# Patient Record
Sex: Female | Born: 1935 | Race: White | Hispanic: No | Marital: Married | Smoking: Former smoker
Health system: Southern US, Community
[De-identification: ages and names within clinical notes are randomized; demographics above are authoritative.]

## PROBLEM LIST (undated history)

## (undated) DIAGNOSIS — M8580 Other specified disorders of bone density and structure, unspecified site: Secondary | ICD-10-CM

## (undated) DIAGNOSIS — Z5189 Encounter for other specified aftercare: Secondary | ICD-10-CM

## (undated) DIAGNOSIS — J449 Chronic obstructive pulmonary disease, unspecified: Secondary | ICD-10-CM

## (undated) DIAGNOSIS — C50919 Malignant neoplasm of unspecified site of unspecified female breast: Secondary | ICD-10-CM

## (undated) DIAGNOSIS — C189 Malignant neoplasm of colon, unspecified: Secondary | ICD-10-CM

## (undated) DIAGNOSIS — M1990 Unspecified osteoarthritis, unspecified site: Secondary | ICD-10-CM

## (undated) DIAGNOSIS — E039 Hypothyroidism, unspecified: Secondary | ICD-10-CM

## (undated) DIAGNOSIS — I499 Cardiac arrhythmia, unspecified: Secondary | ICD-10-CM

## (undated) DIAGNOSIS — I10 Essential (primary) hypertension: Secondary | ICD-10-CM

## (undated) DIAGNOSIS — T7840XA Allergy, unspecified, initial encounter: Secondary | ICD-10-CM

## (undated) HISTORY — PX: COLON SURGERY: SHX602

## (undated) HISTORY — PX: HYSTERECTOMY ABDOMINAL WITH SALPINGO-OOPHORECTOMY: SHX6792

## (undated) HISTORY — DX: Malignant neoplasm of colon, unspecified: C18.9

## (undated) HISTORY — DX: Malignant neoplasm of unspecified site of unspecified female breast: C50.919

## (undated) HISTORY — DX: Chronic obstructive pulmonary disease, unspecified: J44.9

## (undated) HISTORY — PX: VESICOVAGINAL FISTULA CLOSURE W/ TAH: SUR271

## (undated) HISTORY — PX: MASTECTOMY: SHX3

## (undated) HISTORY — DX: Hypothyroidism, unspecified: E03.9

## (undated) HISTORY — DX: Unspecified osteoarthritis, unspecified site: M19.90

## (undated) HISTORY — DX: Other specified disorders of bone density and structure, unspecified site: M85.80

## (undated) HISTORY — DX: Encounter for other specified aftercare: Z51.89

## (undated) HISTORY — PX: NASAL POLYP EXCISION: SHX2068

## (undated) HISTORY — DX: Essential (primary) hypertension: I10

## (undated) HISTORY — DX: Allergy, unspecified, initial encounter: T78.40XA

---

## 2000-07-09 ENCOUNTER — Encounter: Admission: RE | Admit: 2000-07-09 | Discharge: 2000-07-09 | Payer: Self-pay | Admitting: Otolaryngology

## 2000-07-09 ENCOUNTER — Encounter: Payer: Self-pay | Admitting: Otolaryngology

## 2000-07-13 ENCOUNTER — Encounter (INDEPENDENT_AMBULATORY_CARE_PROVIDER_SITE_OTHER): Payer: Self-pay | Admitting: *Deleted

## 2000-07-13 ENCOUNTER — Ambulatory Visit (HOSPITAL_BASED_OUTPATIENT_CLINIC_OR_DEPARTMENT_OTHER): Admission: RE | Admit: 2000-07-13 | Discharge: 2000-07-13 | Payer: Self-pay | Admitting: Otolaryngology

## 2002-12-08 DIAGNOSIS — Z5189 Encounter for other specified aftercare: Secondary | ICD-10-CM

## 2002-12-08 HISTORY — DX: Encounter for other specified aftercare: Z51.89

## 2002-12-08 HISTORY — PX: COLON RESECTION: SHX5231

## 2002-12-15 ENCOUNTER — Inpatient Hospital Stay (HOSPITAL_COMMUNITY): Admission: AD | Admit: 2002-12-15 | Discharge: 2002-12-25 | Payer: Self-pay | Admitting: Internal Medicine

## 2002-12-17 ENCOUNTER — Encounter: Payer: Self-pay | Admitting: Internal Medicine

## 2002-12-17 ENCOUNTER — Encounter (INDEPENDENT_AMBULATORY_CARE_PROVIDER_SITE_OTHER): Payer: Self-pay | Admitting: Specialist

## 2002-12-19 ENCOUNTER — Encounter (INDEPENDENT_AMBULATORY_CARE_PROVIDER_SITE_OTHER): Payer: Self-pay | Admitting: Specialist

## 2003-02-13 ENCOUNTER — Ambulatory Visit (HOSPITAL_BASED_OUTPATIENT_CLINIC_OR_DEPARTMENT_OTHER): Admission: RE | Admit: 2003-02-13 | Discharge: 2003-02-13 | Payer: Self-pay | Admitting: General Surgery

## 2003-02-13 ENCOUNTER — Encounter: Payer: Self-pay | Admitting: General Surgery

## 2003-10-20 ENCOUNTER — Ambulatory Visit (HOSPITAL_BASED_OUTPATIENT_CLINIC_OR_DEPARTMENT_OTHER): Admission: RE | Admit: 2003-10-20 | Discharge: 2003-10-20 | Payer: Self-pay | Admitting: General Surgery

## 2006-01-06 ENCOUNTER — Ambulatory Visit: Payer: Self-pay | Admitting: Internal Medicine

## 2006-01-20 ENCOUNTER — Ambulatory Visit: Payer: Self-pay | Admitting: Internal Medicine

## 2008-10-26 ENCOUNTER — Encounter: Admission: RE | Admit: 2008-10-26 | Discharge: 2008-11-29 | Payer: Self-pay | Admitting: Chiropractic Medicine

## 2009-07-10 ENCOUNTER — Ambulatory Visit: Payer: Self-pay | Admitting: Internal Medicine

## 2009-07-24 ENCOUNTER — Ambulatory Visit: Payer: Self-pay | Admitting: Internal Medicine

## 2011-04-25 NOTE — H&P (Signed)
[name], [name]                          ACCOUNT NO.:  [id number]   MEDICAL RECORD NO.:  [id number]                   PATIENT TYPE:  INP   LOCATION:  3703                                 FACILITY:  [facility]   PHYSICIAN:  [name], M.D. [facility]           DATE OF BIRTH:  Apr 28, 1933   DATE OF ADMISSION:  12/15/2002  DATE OF DISCHARGE:                                HISTORY & PHYSICAL   CHIEF COMPLAINT:  Palpitations and shortness of breath with exertion x 2  weeks.   HISTORY OF PRESENT ILLNESS:  The patient is a 77 year old white female,  patient of Dr. Casimiro Needle B. Warner, followed for asthma in a patient with  minimum smoking history.  The patient presented this morning to the office  for an acute visit related to her two-week history of heart fluttering and  palpitations mainly with walking.  She also had associated dyspnea on  exertion.  The patient had a recent upper respiratory tract infection one  month ago and was treated with a Z-Pak.  She states that her symptoms  totally resolved.  The patient actually had discontinued her Advair and  Flonase after this episode and states she has done exceptionally well  without any wheezing or cough.  She has not had to use her Albuterol greater  than three weeks.  There was some miscommunication in the office about  whether she was supposed to actually discontinue this or not; however, she  has done well off of this.   The patient had denied any chest pain, cough, purulent sputum, leg swelling,  wheezing, presyncope or syncopal episodes, dizziness, calf pain, bloody  stools, or weight loss.  EKG in the office had been essentially  unremarkable; however, blood work revealed that the patient had an extremely  low hemoglobin at 6.5.  The patient states that she had no recent bleeding  episodes or injury.  She rarely uses Aleve over-the-counter for arthritic  complaints.   The patient does not have a primary care physician.  She sees  her  gynecologist Dr. Carlis Warner and her oncologist Dr. Linward Warner in [facility].  She does have a history of breast cancer.  She states her Pap smear  last month was normal and mammogram in June was normal.  The patient will  require hospitalization for evaluation the source of her blood loss and will  require IV blood transfusion.   PAST MEDICAL HISTORY:  1. Asthma.  2. Breast cancer, status post right mastectomy.  3. Hysterectomy secondary to female problems.  4. Nasal polyps.   CURRENT MEDICATIONS:  None.  (The patient had previously been on Flonase 2  sprays twice daily and Advair 100/50 twice daily and Albuterol p.r.n.)   ALLERGIES:  No known drug allergies.   FAMILY HISTORY:  There is a family history of coronary artery disease.  Unknown history for cancer.  Positive history for asthma and  allergies in  the family.   SOCIAL HISTORY:  The patient is married, has five children.  Remote smoker  of greater than 40 years ago.  At that time, she states she was very  minimally exposed, smoked for a very short amount of time.  She drinks on an  average one to two glasses of wine a day.   REVIEW OF SYMPTOMS:  GENERAL:  The patient denies any weight loss.  Does  state some fatigue.  HEENT:  Denies any headaches or visual changes.  CARDIAC:  The patient denies any chest pain, leg swelling.  GASTROINTESTINAL  OR GENITOURINARY:  The patient denies any abdominal pain, nausea, vomiting,  hematochezia, bloody stools.  SKIN:  The patient denies any rash.  NEUROLOGIC:  Negative.   PHYSICAL EXAMINATION:  GENERAL:  The patient is a pleasant white female in  no acute distress.  She does appear somewhat pale.  HEENT:  Conjunctivae is somewhat pale.  Oral mucosa is pale as well.  Nasal  mucosa is pink and moist.  TMs are normal and nontender.  PERRL.  NECK:  Supple without cervical adenopathy.  No JVD.  Carotids are equal with  positive upstrokes bilaterally without any bruits.  LUNGS:   Lung sounds are clear to auscultation bilaterally without any  wheezing or crackles.  CARDIOVASCULAR:  Regular rate and rhythm without murmur or gallop.  ABDOMEN:  Soft without any hepatosplenomegaly.  No guarding or rebound  noted.  Bowel sounds are positive throughout in all four quadrants.  No  abdominal bruits or masses appreciated.  EXTREMITIES:  Warm without any calf tenderness, cyanosis, clubbing, or  edema.  Moves all extremities well.  NEUROLOGICAL:  Alert and oriented x 3.  There are no focal deficits  detected.   LABORATORY DATA:  Hemoglobin at 6.5, hematocrit 21.6, platelet count at  284,000.  MCV was 76.  White count was at 6.4.   EKG showed a normal sinus rhythm.   IMPRESSION:  Severe anemia, questionable source of blood loss:  The patient  will be admitted for further evaluation.  Total iron binding capacity, iron,  ferratin, and reticulocyte are all pending, along with stool hemoccults.  The patient will be typed and crossed for two units and will be transfused.  We will follow up accordingly.     Tammy Parrett, P.A. [facility]                   Michael B. Sherene Warner, M.D. [facility]    TP/MEDQ  D:  12/15/2002  T:  12/15/2002  Job:  [id number]   cc:   [name]. Sherene Warner, M.D. [facility]  [address]  [place]  [place] [address]  Fax: [phone]

## 2011-04-25 NOTE — Consult Note (Signed)
NAMEMEOSHIA, BILLING                          ACCOUNT NO.:  [id number]   MEDICAL RECORD NO.:  [id number]                   PATIENT TYPE:  INP   LOCATION:  5736                                 FACILITY:  [facility]   PHYSICIAN:  Merilynn Finland, M.D.                DATE OF BIRTH:  June 20, 1933   DATE OF CONSULTATION:  12/23/2002  DATE OF DISCHARGE:                                   CONSULTATION   REASON FOR CONSULTATION:  Adenocarcinoid of the cecum.   HISTORY OF PRESENT ILLNESS:  The patient is a 77 year old woman who is  admitted to [facility] on December 15, 2002 with severe  anemia.  Her admission laboratory work showed a hemoglobin of 5.6, MCV 74,  white blood cell count 3.9, platelet count 240,000.  Reticulocyte 1.4%.  Iron less than 10 and a ferratin less of 4.  The patient was transfused with  two units of packed red blood cells with her hemoglobin up to 9.3.  She was  noted to have heme positive stool.   Colonoscopy on December 17, 2002 showed an nonobstructing large cecal mass,  partly involving the ileocecal valve.  She was also noted to have polyp at  90 cm.  Pathology on the cecal mass showed abundant and cloudy material with  cavernous inflammatory exudate and rare atypical glands.  The colon polyp  was negative for high grade dysplasia or invasive malignancy ([id number]).  Preoperative CEA 1.2.  CTs of the abdomen and pelvis on December 17, 2002  showed moderate to marked emphysema in the lung bases.  No definite liver  mass.  Vague area of hypodensity in the liver.  Cecal mass with necrotic  paracecal adenopathy and free pelvic fluid.   On December 19, 2000, the patient underwent right hemicolectomy, wedge biopsy  of the liver nodule with findings of a large mobile mass of the cecum,  several large mesenteric lymph nodes, and a nodule right lobe of the liver.  Final pathology [id number]) showed a 4.5 cm adenocarcinoid tumor invasive into  pericolonic adipose  tissue, negative margins, positive vascular/lymphatic  invasion, and involvement of two of the eleven lymph nodes.  The pathology  on the liver nodule showed a cavernous hemangioma.  Oncology consultation  was requested for further evaluation.   PAST MEDICAL HISTORY:  1. History of breast cancer, status post right mastectomy approximately 20     years ago with two positive axillary lymph nodes.  This was followed by     chemotherapy.  2. Asthma.  3. DJD.  4. Nasal polyps.  5. Status post hysterectomy in 1982.   MEDICATIONS:  1. Protonix 40 mg b.i.d.  2. Dilaudid PCA.  3. Lovenox 40 mg q.d.   ALLERGIES:  No known drug allergies.   FAMILY HISTORY:  Mother deceased at age 82 with questionable kidney cancer.  Father deceased with a MI.  The patient  has three half-brothers who are all  reported to be healthy.   SOCIAL HISTORY:  The patient lives in [place].  She is married.  She has  five children who are all healthy.  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  She denies any history of tobacco use.  She reports occasional ETOH intake.   REVIEW OF SYMPTOMS:  The patient reports her weight to be stable.  She has a  good appetite.  Her energy has been poor.  She denies any fever.  She denies  any pain prior to surgery.  She was having no unusual headaches or vision  changes.  She has been experiencing some dyspnea on exertion and some  palpitations for the past three to four weeks.  She denies any cough.  She  denies any chest pain.  She has had no peripheral edema.  She does report  occasional constipation.  She denies any bright red blood per rectum or  melena.  She denies any hematuria or dysuria.   PHYSICAL EXAMINATION:  VITAL SIGNS:  Temperature 97.4, heart rate 69,  respirations 20, blood pressure 169/71.  Oxygen saturation 94% on room air.  Weight 105 pounds.  GENERAL:  A pleasant female in no acute distress.  HEENT:  Normocephalic and atraumatic.  Pupils are equal, round, and  reactive  to light.  Extraocular movements intact.  Sclerae are anicteric.  Conjunctivae pallor.  Oropharynx is clear.  Tongue is drug.  LYMPH NODE:  A few small left axillary lymph nodes.  CHEST:  Diminished breath sounds bilaterally.  Right mastectomy.  CARDIOVASCULAR:  Regular rate and rhythm.  ABDOMEN:  Soft and nontender.  Bowel sounds are hypoactive.  EXTREMITIES:  No clubbing, cyanosis, or edema.  NEUROLOGICAL:  Alert and oriented.  Moves all extremities.   LABORATORY DATA:  Hemoglobin 9, white count 7.4, platelets 233,000.  Sodium  14, potassium 3.8, BUN 5, creatinine 0.8, glucose 115, calcium 8.5.  Preoperative CEA 1.2.   Abdominal CT:  No definite liver mass, some vague areas of hypodensity in  the liver.  Emphysema.  Pelvic CT:  Cecal mass with necrotic pericecal  adenopathy and free pelvic fluid.   IMPRESSION:  The patient is a 77 year old woman with a history of breast  cancer, status post right mastectomy and chemotherapy approximately 20 years  ago.  She presented with severe anemia and was found to have a cecal mass.  She is now status post right hemicolectomy for a T3 N1 adenocarcinoid of the  cecum.  We will also standard 26fu/leucovorin for six cycles when she  recovers.  She has not yet decided on where she would like to receive her  treatments.  We will, however, go ahead and make her appointment for follow-  up at the [facility]   The patient was seen and examined by Dr. Merilynn Finland.  The chart  reviewed.     Lonna Cobb, N.P.                         Merilynn Finland, M.D.    LT/MEDQ  D:  12/23/2002  T:  12/24/2002  Job:  [id number]   cc:   Angelia Mould. Derrell Lolling, M.D.  [address]  [place]  [place] [address]  Fax: [phone]   Charlaine Dalton. Sherene Sires, M.D. [facility]  [address]  [place]  [place] [address]  Fax: 1   Lina Sar, M.D. [facility]

## 2011-04-25 NOTE — Op Note (Signed)
   NAMEKYLIE, [name]                        ACCOUNT NO.:  [id number]   MEDICAL RECORD NO.:  [id number]                   PATIENT TYPE:  AMB   LOCATION:  DSC                                  FACILITY:  [facility]   PHYSICIAN:  Angelia Mould. Derrell Lolling, M.D.             DATE OF BIRTH:  May 18, 1933   DATE OF PROCEDURE:  10/28/2003  DATE OF DISCHARGE:                                 OPERATIVE REPORT   PREOPERATIVE DIAGNOSIS:  Colon cancer.   POSTOPERATIVE DIAGNOSIS:  Colon cancer.   OPERATION PERFORMED:  Removal of port-A-Cath.   SURGEON:  Angelia Mould. Derrell Lolling, M.D.   OPERATIVE INDICATIONS:  This is a 77 year old white female who underwent a  right colectomy on December 19, 2002.  She had a T3, N1 adenocarcinoid.  She  has undergone chemotherapy and just finished that.  She was sent back to me  for removal of her port-A-Cath.  She has been feeling well and has had no  problems with the port-A-Cath.   OPERATIVE TECHNIQUE:  The patient was brought to the minor procedure room at  [facility].  She was placed supine on the operating table.  The right  upper chest and neck were prepped and draped in a sterile fashion.  1%  Xylocaine with epinephrine was used as a local infiltration anesthetic. A  transverse incision was made at the lower edge of the port through the  previous scar.  Dissection was carried down into the subcutaneous tissue,  and the port was dissected away from the subcutaneous tissue.  The cuff of  the port had to be dissected as well, and then the port and the catheter  came out easily in one piece.  There was no bleeding.  The subcutaneous  tissue was closed with interrupted sutures of 3-0 Vicryl, and the skin was  closed with a running subcuticular suture of 5-0 Monocryl and Steri-Strips.  Clean bandages were placed, and the patient was taken to the recovery room  in stable condition.  The estimated blood loss was about 3 mL.   COMPLICATIONS:  None.   SPONGE AND  INSTRUMENT COUNTS:  Correct.                                               Angelia Mould. Derrell Lolling, M.D.    HMI/MEDQ  D:  10/20/2003  T:  10/21/2003  Job:  [id number]   cc:   [facility]

## 2011-04-25 NOTE — Discharge Summary (Signed)
[name], [name]                          ACCOUNT NO.:  [id number]   MEDICAL RECORD NO.:  [id number]                   PATIENT TYPE:  INP   LOCATION:  5736                                 FACILITY:  [facility]   PHYSICIAN:  Angelia Mould. Derrell Lolling, M.D.             DATE OF BIRTH:  [birth year]   DATE OF ADMISSION:  12/15/2002  DATE OF DISCHARGE:  12/25/2002                                 DISCHARGE SUMMARY   FINAL DIAGNOSES:  1. Adenocarcinoid tumor of the cecum, stage T3 N1.  2. Blood loss anemia secondary to #1.  3. Asthma.  4. Breast cancer, status post right mastectomy, no known recurrence.   PROCEDURE:  1. Colonoscopy, December 17, 2002.  2. Right colectomy and liver biopsy, December 19, 2002.   HISTORY OF PRESENT ILLNESS:  This is a 77 year old white female who was  admitted on December 15, 2002 with fatigue, shortness of breath, and  palpitations.  She had a recent pneumonia which was resolving.  She denied  weight loss, abdominal pain, or blood in her stools.  On admission,  hemoccult positive stools were identified.  A hemoglobin of 6.5 was  identified on admission.   She had a past history of asthma, and initially it was questioned whether  she was having exacerbation of her pulmonary disease.   PHYSICAL EXAMINATION:  GENERAL:  A pleasant, alert female in no distress.  VITAL SIGNS:  Blood pressure 115/60, heart rate 68, respiratory rate 20.  NECK:  No adenopathy or jugular venous distention.  LUNGS:  Clear to auscultation.  HEART:  Regular rate and rhythm.  No murmur.  ABDOMEN:  Soft and nontender.  There was a small palpable mass in the right  lower quadrant.  The liver and spleen did not appear enlarged.  EXTREMITIES:  No edema.   HOSPITAL COURSE:  The patient was admitted by Dr. Nyoka Cowden.  The  anemia was identified and she was transfused at least two units of packed  red blood cells which brought her hemoglobin up from 6.5 to 9.3.  Hemoccult  positive stools were  identified.  Dr. Lina Sar was asked to see her and  colonoscopy was performed which showed an ulcerated mass in the cecum  consistent with cancer.  There was also a benign polyp in the splenic  flexure which was removed.   At that point, I was asked to see her in consultation.  I advised her to  undergo completion of her bowel prep and right colon resection and she  consented to that.   Preoperative CEA was 1.2.  Preoperative CT scan did show a mass in the cecum  as a pericecal mesenteric nodes.  The liver looked normal by CT.   The patient did complete a bowel prep and was taken to the operating room on  December 19, 2002.  She underwent a right hemicolectomy with primary  anastomosis and a liver  biopsy.  Liver biopsy was a whitish plaque on the  undersurface of the liver which was benign bile duct hamartoma.  There was  enlarged mesenteric lymph nodes but the liver and ovaries looked normal.   The pathology report showed an adenocarcinoid tumor with 2 out of 11 lymph  nodes positive for metastatic adenocarcinoid tumor.  This was stage T3 N1.   Postoperatively, the patient did reasonably well.  She was advised of her  pathology.  After a few days, we were able to initiate diet and bowel  function returned.  She became ambulatory without much difficulty.  She was  seen in consultation by Dr. Merilynn Finland.  His advise was to offer  standard adjuvant 8fu and Leucovorin for six cycles when she recovered from  the surgery.  A follow-up appointment in his office was made.   The patient was discharged on December 25, 2002.  At that time, she was  tolerating diet, had bowel movements, was feeling well enough to go home.  Her wound was clean.  Staples were removed.  She was asked to follow up with  me in the office in one week.  She was to see Dr. Merilynn Finland on January 03, 2003.  She was given prescription for Vicodin for pain.                                               Angelia Mould. Derrell Lolling, M.D.    HMI/MEDQ  D:  01/13/2003  T:  01/13/2003  Job:  [id number]   cc:   Charlaine Dalton. Sherene Sires, M.D. [facility]  [address]  [place]  [place] [address]  Fax: 1   Lina Sar, M.D. [facility]   Merilynn Finland, M.D.  [address] [place] [facility]  [place]  [place] [address]  Fax: [phone]

## 2011-05-07 ENCOUNTER — Encounter: Payer: Self-pay | Admitting: Internal Medicine

## 2011-05-07 ENCOUNTER — Ambulatory Visit (INDEPENDENT_AMBULATORY_CARE_PROVIDER_SITE_OTHER): Payer: Medicare Other | Admitting: Internal Medicine

## 2011-05-07 VITALS — BP 132/74 | HR 65 | Temp 97.5°F | Ht 62.5 in | Wt 122.0 lb

## 2011-05-07 DIAGNOSIS — J45909 Unspecified asthma, uncomplicated: Secondary | ICD-10-CM | POA: Insufficient documentation

## 2011-05-07 DIAGNOSIS — J310 Chronic rhinitis: Secondary | ICD-10-CM

## 2011-05-07 NOTE — Patient Instructions (Signed)
Start Advair 100/50  One twice daily but work on technique - smooth deep drag and out thru nose  I emphasized that nasal steroids(flutisone)  have no immediate benefit in terms of improving symptoms.  To help them reached the target tissue, the patient should use Afrin two puffs every 12 hours applied one min before using the nasal steroids.  Afrin should be stopped after no more than 5 days.  If the symptoms worsen, Afrin can be restarted after 5 days off of therapy to prevent rebound congestion from overuse of Afrin.  I also emphasized that in no way are nasal steroids a concern in terms of "addiction".   GERD (REFLUX)  is an extremely common cause of respiratory symptoms, many times with no significant heartburn at all.    It can be treated with medication, but also with lifestyle changes including avoidance of late meals, excessive alcohol, smoking cessation, and avoid fatty foods, chocolate, peppermint, colas, red wine, and acidic juices such as orange juice.  NO MINT OR MENTHOL PRODUCTS SO NO COUGH DROPS  USE SUGARLESS CANDY INSTEAD (jolley ranchers or Stover's)  NO OIL BASED VITAMINS   Please schedule a follow up office visit in 4 weeks, sooner if needed with PFT's on return

## 2011-05-07 NOTE — Progress Notes (Signed)
Subjective:     Patient ID: [name], female   DOB: 08/05/1933, 77 y.o.   MRN: [id number]  HPI  Primary = [name] with [facility]   77 yowf  mininimal smoking hx with dx of asthma last seen in Pulmonary clinic in 2002  05/07/2011 Initial pulmonary office eval in EMR era cc sev months sob/ chest tightness perceived improvement on  primatene and fllonase whereas previously needed nothing main problem occurs typically after supper or after going to sleep with subjective wheezing and nasal congestion plus sob with ex which is new. Cough dry.   Pt denies any significant sore throat, dysphagia, itching, sneezing,  nasal congestion or excess/ purulent secretions,  fever, chills, sweats, unintended wt loss, pleuritic or exertional cp, hempoptysis, orthopnea pnd or leg swelling.    Also denies any obvious fluctuation of symptoms with weather or environmental changes or other aggravating or alleviating factors.       Review of Systems  Constitutional: Negative for fever, chills and unexpected weight change.  HENT: Positive for ear pain and congestion. Negative for nosebleeds, sore throat, rhinorrhea, sneezing, trouble swallowing, dental problem, voice change, postnasal drip and sinus pressure.   Eyes: Negative for visual disturbance.  Respiratory: Positive for cough and shortness of breath. Negative for choking.   Cardiovascular: Negative for chest pain and leg swelling.  Gastrointestinal: Negative for vomiting, abdominal pain and diarrhea.  Genitourinary: Negative for difficulty urinating.  Musculoskeletal: Negative for arthralgias.  Skin: Negative for rash.  Neurological: Negative for tremors, syncope and headaches.  Hematological: Does not bruise/bleed easily.       Objective:   Physical Exam amb wf real shaky with medical details does not know primary doctor's name or location Wt 122 05/07/11 HEENT: nl dentition, turbinates, and orophanx. Nl external ear canals  without cough reflex   NECK :  without JVD/Nodes/TM/ nl carotid upstrokes bilaterally   LUNGS: no acc muscle use, clear to A and P bilaterally without cough on insp or exp maneuvers   CV:  RRR  no s3 or murmur or increase in P2, no edema   ABD:  soft and nontender with nl excursion in the supine position. No bruits or organomegaly, bowel sounds nl  MS:  warm without deformities, calf tenderness, cyanosis or clubbing  SKIN: warm and dry without lesions    NEURO:  alert, approp, no deficits      Assessment:         Plan:

## 2011-05-08 DIAGNOSIS — J310 Chronic rhinitis: Secondary | ICD-10-CM | POA: Insufficient documentation

## 2011-05-08 NOTE — Assessment & Plan Note (Signed)
Symptoms are markedly disproportionate to objective findings and not clear this is a lung problem but pt does appear to have difficult airway management issues.   DDX of  difficult airways managment all start with A and  include Adherence, Ace Inhibitors, Acid Reflux, Active Sinus Disease, Alpha 1 Antitripsin deficiency, Anxiety masquerading as Airways dz,  ABPA,  allergy(esp in young), Aspiration (esp in elderly), Adverse effects of DPI,  Active smokers, plus two Bs  = Bronchiectasis and Beta blocker use..and one C= CHF  Adherence is always the initial "prime suspect" and is a multilayered concern that requires a "trust but verify" approach in every patient - starting with knowing how to use medications, especially inhalers, correctly, keeping up with refills and understanding the fundamental difference between maintenance and prns vs those medications only taken for a very short course and then stopped and not refilled.  Will try on lowest dose of Advair twice daily and see if perceived need for primatene improves with symptom control  ? Acid reflux > diet reviewed  ? Anxiety > dx of exclusion  The proper method of use, as well as anticipated side effects, of this Dry powder inhaler are discussed and demonstrated to the patient. Only improved to 75% with repeat coaching

## 2011-05-08 NOTE — Assessment & Plan Note (Signed)
I emphasized that nasal steroids have no immediate benefit in terms of improving symptoms.  To help them reached the target tissue, the patient should use Afrin two puffs every 12 hours applied one min before using the nasal steroids.  Afrin should be stopped after no more than 5 days.  If the symptoms worsen, Afrin can be restarted after 5 days off of therapy to prevent rebound congestion from overuse of Afrin.  I also emphasized that in no way are nasal steroids a concern in terms of "addiction".  

## 2011-06-06 ENCOUNTER — Ambulatory Visit (INDEPENDENT_AMBULATORY_CARE_PROVIDER_SITE_OTHER): Payer: Medicare Other | Admitting: Internal Medicine

## 2011-06-06 ENCOUNTER — Ambulatory Visit (INDEPENDENT_AMBULATORY_CARE_PROVIDER_SITE_OTHER)
Admission: RE | Admit: 2011-06-06 | Discharge: 2011-06-06 | Disposition: A | Discharge status: Home / Self Care | Payer: Medicare Other | Source: Ambulatory Visit | Attending: Internal Medicine | Admitting: Internal Medicine

## 2011-06-06 ENCOUNTER — Encounter (INDEPENDENT_AMBULATORY_CARE_PROVIDER_SITE_OTHER): Payer: Medicare Other

## 2011-06-06 ENCOUNTER — Encounter: Payer: Self-pay | Admitting: Internal Medicine

## 2011-06-06 VITALS — BP 118/70 | HR 62 | Temp 97.6°F | Ht 63.0 in | Wt 122.0 lb

## 2011-06-06 DIAGNOSIS — J45909 Unspecified asthma, uncomplicated: Secondary | ICD-10-CM

## 2011-06-06 MED ORDER — FLUTICASONE-SALMETEROL 100-50 MCG/DOSE IN AEPB: 1.0000 | INHALATION_SPRAY | Freq: Two times a day (BID) | RESPIRATORY_TRACT | Status: DC

## 2011-06-06 NOTE — Patient Instructions (Signed)
We will call you with cxr report  Please schedule a follow up visit in 3 months but call sooner if needed

## 2011-06-06 NOTE — Progress Notes (Signed)
Subjective:     Patient ID: [name], female   DOB: 25-Feb-1933, 77 y.o.   MRN: [id number]  HPI  Primary = [name] with [facility]   78 yowf  mininimal smoking hx with dx of asthma last seen in Pulmonary clinic in 2002  05/07/2011 Initial pulmonary office eval in EMR era cc sev months sob/ chest tightness perceived improvement on  primatene and fllonase whereas previously needed nothing main problem occurs typically after supper or after going to sleep with subjective wheezing and nasal congestion plus sob with ex which is new. Cough dry.  rec Start Advair 100/50  One twice daily but work on technique -   I emphasized that nasal steroids(flutisone)  have no immediate benefit in terms of improving symptoms.  To help them reached the target tissue, the patient should use Afrin two puffs every 12 hours applied one min before using the nasal steroids.  Afrin should be stopped after no more than 5 days.  If the symptoms worsen, Afrin can be restarted after 5 days off of therapy to prevent rebound congestion from overuse of Afrin.  I also emphasized that in no way are nasal steroids a concern in terms of "addiction".   GERD (REFLUX)  Diet reviewed in writeing.  06/06/2011 ov/[name]  Cc sob better with no need for primatene since started advair. No cough. Pt denies any significant sore throat, dysphagia, itching, sneezing,  nasal congestion or excess/ purulent secretions,  fever, chills, sweats, unintended wt loss, pleuritic or exertional cp, hempoptysis, orthopnea pnd or leg swelling.    Also denies any obvious fluctuation of symptoms with weather or environmental changes or other aggravating or alleviating factors.              Objective:   Physical Exam amb wf real shaky with medical details does not know primary doctor's name or location Wt 122 05/07/11  > 122 06/06/2011  HEENT: nl dentition, turbinates, and orophanx. Nl external ear canals without cough reflex   NECK :   without JVD/Nodes/TM/ nl carotid upstrokes bilaterally   LUNGS: no acc muscle use, clear to A and P bilaterally without cough on insp or exp maneuvers   CV:  RRR  no s3 or murmur or increase in P2, no edema   ABD:  soft and nontender with nl excursion in the supine position. No bruits or organomegaly, bowel sounds nl  MS:  warm without deformities, calf tenderness, cyanosis or clubbing       cxr 06/06/11 Hyperinflation without acute superimposed process  Assessment:         Plan:

## 2011-06-08 ENCOUNTER — Encounter: Payer: Self-pay | Admitting: Internal Medicine

## 2011-06-08 NOTE — Assessment & Plan Note (Signed)
Marked improvement in symptoms on very low dose advair  All goals of chronic asthma control met including optimal function and elimination of symptoms with minimal need for rescue therapy.  Contingencies discussed in full including contacting this office immediately if not controlling the symptoms using the rule of two's.     See instructions for specific recommendations which were reviewed directly with the patient who was given a copy with highlighter outlining the key components.

## 2011-06-13 NOTE — Progress Notes (Signed)
Quick Note:  Spoke with pt and notified of results per Dr. Wert. Pt verbalized understanding and denied any questions.  ______ 

## 2011-09-08 ENCOUNTER — Encounter: Payer: Self-pay | Admitting: Internal Medicine

## 2011-09-08 ENCOUNTER — Ambulatory Visit (INDEPENDENT_AMBULATORY_CARE_PROVIDER_SITE_OTHER): Payer: Medicare Other | Admitting: Internal Medicine

## 2011-09-08 DIAGNOSIS — J310 Chronic rhinitis: Secondary | ICD-10-CM

## 2011-09-08 DIAGNOSIS — J45909 Unspecified asthma, uncomplicated: Secondary | ICD-10-CM

## 2011-09-08 MED ORDER — ALBUTEROL SULFATE HFA 108 (90 BASE) MCG/ACT IN AERS: 2.0000 | INHALATION_SPRAY | Freq: Four times a day (QID) | RESPIRATORY_TRACT | Status: DC | PRN

## 2011-09-08 MED ORDER — FLUTICASONE-SALMETEROL 100-50 MCG/DOSE IN AEPB: 1.0000 | INHALATION_SPRAY | Freq: Two times a day (BID) | RESPIRATORY_TRACT | Status: DC

## 2011-09-08 NOTE — Patient Instructions (Addendum)
Use proaire as needed for breathing difficulty  Work on inhaler technique:  relax and gently blow all the way out then take a nice smooth deep breath back in, triggering the inhaler at same time you start breathing in.  Hold for up to 5 seconds if you can.  Rinse and gargle with water when done   If your mouth or throat starts to bother you,   I suggest you time the inhaler to your dental care and after using the inhaler(s) brush teeth and tongue with a baking soda containing toothpaste and when you rinse this out, gargle with it first to see if this helps your mouth and throat.      I emphasized that nasal steroids(flonase)  have no immediate benefit in terms of improving symptoms.  To help them reached the target tissue, the patient should use Afrin two puffs every 12 hours applied one min before using the nasal steroids.  Afrin should be stopped after no more than 5 days.  If the symptoms worsen, Afrin can be restarted after 5 days off of therapy to prevent rebound congestion from overuse of Afrin.  I also emphasized that in no way are nasal steroids a concern in terms of "addiction".   If not working to your satisfaction you need to return to your ENT doctor     If you are satisfied with your treatment plan let your doctor know and he/she can either refill your medications or you can return here when your prescription runs out.     If in any way you are not 100% satisfied,  please tell us.  If 100% better, tell your friends!

## 2011-09-08 NOTE — Progress Notes (Signed)
Subjective:     Patient ID: [name], female   DOB: 1933/08/02, 77 y.o.   MRN: [id number]  HPI  Primary = Hilliard Clark with [facility]   77 yowf  mininimal smoking hx with dx of asthma/ past hx polyps but no asa sensitity  last seen in Pulmonary clinic in 2002  05/07/2011 Initial pulmonary office eval in EMR era cc sev months sob/ chest tightness perceived improvement on  primatene and fllonase whereas previously needed nothing main problem occurs typically after supper or after going to sleep with subjective wheezing and nasal congestion plus sob with ex which is new. Cough dry.  rec Start Advair 100/50  One twice daily but work on technique  I emphasized that nasal steroids(flutisone)  have no immediate benefit in terms of improving symptoms.  To help them reached the target tissue, the patient should use Afrin two puffs every 12 hours applied one min before using the nasal steroids.  Afrin should be stopped after no more than 5 days.  If the symptoms worsen, Afrin can be restarted after 5 days off of therapy to prevent rebound congestion from overuse of Afrin.  I also emphasized that in no way are nasal steroids a concern in terms of "addiction".   GERD (REFLUX)  Diet reviewed in writeing.  06/06/2011 ov/Lavonia Eager  Cc sob better with no need for primatene since started advair. No cough. rec No change rx    09/08/2011 f/u ov/Carly Sabo cc breathing no cough or sob.  Watery rhinitis q am x years is her only complaint, no change with seasons or weather or exp.  Sleeping ok without nocturnal  or early am exacerbation  of respiratory  c/o's or need for noct saba. Also denies any obvious fluctuation of symptoms with weather or environmental changes or other aggravating or alleviating factors except as outlined above   Pt denies any significant sore throat, dysphagia, itching, sneezing,  nasal congestion or excess/ purulent secretions,  fever, chills, sweats, unintended wt loss, pleuritic  or exertional cp, hempoptysis, orthopnea pnd or leg swelling.                Objective:   Physical Exam  amb wf nad   Wt 122 05/07/11  > 122 06/06/2011 > 124 09/08/2011   HEENT: nl dentition,and orophanx. L nasal polyp Nl external ear canals without cough reflex   NECK :  without JVD/Nodes/TM/ nl carotid upstrokes bilaterally   LUNGS: no acc muscle use, clear to A and P bilaterally without cough on insp or exp maneuvers   CV:  RRR  no s3 or murmur or increase in P2, no edema   ABD:  soft and nontender with nl excursion in the supine position. No bruits or organomegaly, bowel sounds nl  MS:  warm without deformities, calf tenderness, cyanosis or clubbing       cxr 06/06/11 Hyperinflation without acute superimposed process  Assessment:         Plan:

## 2011-09-09 ENCOUNTER — Encounter: Payer: Self-pay | Admitting: Internal Medicine

## 2011-09-09 NOTE — Assessment & Plan Note (Addendum)
All goals of chronic asthma control met including optimal function and elimination of symptoms with minimal need for rescue therapy.  Contingencies discussed in full including contacting this office immediately if not controlling the symptoms using the rule of two's.     See instructions for specific recommendations which were reviewed directly with the patient who was given a copy with highlighter outlining the key components.   The proper method of use, as well as anticipated side effects, of this metered-dose inhaler are discussed and demonstrated to the patient. Improved to 75% with coaching

## 2011-09-09 NOTE — Assessment & Plan Note (Addendum)
She has recurrent polyps on Left and can certainly try max topical rx as outlined in recs but low threshold to refer back to ent

## 2012-04-27 ENCOUNTER — Encounter: Payer: Self-pay | Admitting: Internal Medicine

## 2012-05-11 ENCOUNTER — Encounter: Payer: Self-pay | Admitting: Internal Medicine

## 2012-07-01 ENCOUNTER — Ambulatory Visit (AMBULATORY_SURGERY_CENTER): Payer: Medicare Other | Admitting: *Deleted

## 2012-07-01 ENCOUNTER — Encounter: Payer: Self-pay | Admitting: Internal Medicine

## 2012-07-01 VITALS — Ht 62.0 in | Wt 117.4 lb

## 2012-07-01 DIAGNOSIS — Z85038 Personal history of other malignant neoplasm of large intestine: Secondary | ICD-10-CM

## 2012-07-01 DIAGNOSIS — Z1211 Encounter for screening for malignant neoplasm of colon: Secondary | ICD-10-CM

## 2012-07-01 MED ORDER — MOVIPREP 100 G PO SOLR: ORAL | Status: DC

## 2012-07-15 ENCOUNTER — Ambulatory Visit (AMBULATORY_SURGERY_CENTER): Payer: Medicare Other | Admitting: Internal Medicine

## 2012-07-15 ENCOUNTER — Encounter: Payer: Self-pay | Admitting: Internal Medicine

## 2012-07-15 VITALS — BP 144/69 | HR 64 | Temp 98.5°F | Resp 16 | Ht 62.0 in | Wt 117.0 lb

## 2012-07-15 DIAGNOSIS — D126 Benign neoplasm of colon, unspecified: Secondary | ICD-10-CM

## 2012-07-15 DIAGNOSIS — Z1211 Encounter for screening for malignant neoplasm of colon: Secondary | ICD-10-CM

## 2012-07-15 DIAGNOSIS — Z85038 Personal history of other malignant neoplasm of large intestine: Secondary | ICD-10-CM

## 2012-07-15 MED ORDER — SODIUM CHLORIDE 0.9 % IV SOLN: 500.0000 mL | INTRAVENOUS | Status: DC

## 2012-07-15 NOTE — Op Note (Signed)
[facility] [address] [place], [place]  [address]  COLONOSCOPY PROCEDURE REPORT  PATIENT:  [name], [name]  MR#:  [id number] BIRTHDATE:  06/28/1933, 79 yrs. old  GENDER:  female ENDOSCOPIST:  Hedwig Morton. Juanda Chance, MD REF. BY:  Juline Patch, M.D. PROCEDURE DATE:  07/15/2012 PROCEDURE:  Colon with cold biopsy polypectomy ASA CLASS:  Class II INDICATIONS:  history of colon cancer right hemicolectomy 2004, colon in 2007, 2010, doing well MEDICATIONS:   MAC sedation, administered by CRNA, propofol (Diprivan) 450 mg  DESCRIPTION OF PROCEDURE:   After the risks and benefits and of the procedure were explained, informed consent was obtained. Digital rectal exam was performed and revealed no rectal masses. The LB CF-H180AL [id number] endoscope was introduced through the anus and advanced to the anastomosis.  The quality of the prep was excellent, using MoviPrep.  The instrument was then slowly withdrawn as the colon was fully examined. <<PROCEDUREIMAGES>>  FINDINGS:  The right colon was surgically resected and an ileo-colonic anastamosis was seen (see image2, image3, and image4). widely patent ileo-colic anastomosis  Mild diverticulosis was found in the sigmoid colon (see image5 and image1). very difficult turn at 20 cm, lumen angulated, had to use pediatric scope  A diminutive polyp was found. 2 mm rectal polyp The polyp was removed using cold biopsy forceps (see image7).  Internal Hemorrhoids were found (see image8 and image6).   Retroflexed views in the rectum revealed no abnormalities.    The scope was then withdrawn from the patient and the procedure completed.  COMPLICATIONS:  None ENDOSCOPIC IMPRESSION: 1) Prior right hemi-colectomy 2) Mild diverticulosis in the sigmoid colon 3) Diminutive polyp 4) Internal hemorrhoids no evidence of recurrent carcinoma RECOMMENDATIONS: 1) High fiber diet.  REPEAT EXAM:  In 5 year(s) for.  ______________________________ Hedwig Morton.  Juanda Chance, MD  CC:  n. eSIGNED:   Hedwig Morton. Brodie at 07/15/2012 10:21 AM  Sallyanne Kuster, [phone]

## 2012-07-15 NOTE — Patient Instructions (Addendum)

## 2012-07-15 NOTE — Progress Notes (Signed)
COLONOSCOPE CHANGED PAST 3 MIN INSERTION TIME TO PEDIATRIC  SCOPE. PEDIATRIC SCOPE WITH MALFUNCTION, CHANGED AFTER 3 MINUTE INSERTION TIME.

## 2012-07-15 NOTE — Progress Notes (Signed)
Patient did not experience any of the following events: a burn prior to discharge; a fall within the facility; wrong site/side/patient/procedure/implant event; or a hospital transfer or hospital admission upon discharge from the facility. (G8907) Patient did not have preoperative order for IV antibiotic SSI prophylaxis. (G8918)  

## 2012-07-16 ENCOUNTER — Telehealth: Payer: Self-pay | Admitting: *Deleted

## 2012-07-16 NOTE — Telephone Encounter (Signed)
  Follow up Call-  Call back number 07/15/2012  Post procedure Call Back phone  # [phone]  Permission to leave phone message Yes     Patient questions:  Do you have a fever, pain , or abdominal swelling? no Pain Score  0 *  Have you tolerated food without any problems? yes  Have you been able to return to your normal activities? yes  Do you have any questions about your discharge instructions: Diet   no Medications  no Follow up visit  no  Do you have questions or concerns about your Care? no  Actions: * If pain score is 4 or above: No action needed, pain <4. Spoke with husband who states pt is just fine. Has had no issues or problems. ewm

## 2012-07-20 ENCOUNTER — Encounter: Payer: Self-pay | Admitting: Internal Medicine

## 2012-09-03 ENCOUNTER — Telehealth: Payer: Self-pay | Admitting: Internal Medicine

## 2012-09-03 MED ORDER — ALBUTEROL SULFATE HFA 108 (90 BASE) MCG/ACT IN AERS: 2.0000 | INHALATION_SPRAY | Freq: Four times a day (QID) | RESPIRATORY_TRACT | Status: AC | PRN

## 2012-09-03 NOTE — Telephone Encounter (Signed)
Last OV with Dr. Sherene Sires 09/08/11 No pending appts.  [facility] called for refill as pt is there waiting for proair.  Gave VO to Darden with instructions pt needs to call for an appt. Brycha verbalized understanding.

## 2013-01-03 ENCOUNTER — Other Ambulatory Visit: Payer: Self-pay | Admitting: Internal Medicine

## 2013-01-03 DIAGNOSIS — R7989 Other specified abnormal findings of blood chemistry: Secondary | ICD-10-CM

## 2013-01-07 ENCOUNTER — Ambulatory Visit
Admission: RE | Admit: 2013-01-07 | Discharge: 2013-01-07 | Disposition: A | Discharge status: Home / Self Care | Payer: Medicare Other | Source: Ambulatory Visit | Attending: Internal Medicine | Admitting: Internal Medicine

## 2013-01-07 DIAGNOSIS — R7989 Other specified abnormal findings of blood chemistry: Secondary | ICD-10-CM

## 2013-02-07 ENCOUNTER — Other Ambulatory Visit: Payer: Self-pay | Admitting: Internal Medicine

## 2013-02-21 ENCOUNTER — Encounter: Payer: Self-pay | Admitting: Internal Medicine

## 2013-02-21 ENCOUNTER — Ambulatory Visit (INDEPENDENT_AMBULATORY_CARE_PROVIDER_SITE_OTHER): Payer: Medicare Other | Admitting: Internal Medicine

## 2013-02-21 VITALS — BP 118/76 | HR 59 | Temp 96.5°F | Ht 64.0 in | Wt 119.0 lb

## 2013-02-21 DIAGNOSIS — J45909 Unspecified asthma, uncomplicated: Secondary | ICD-10-CM

## 2013-02-21 NOTE — Patient Instructions (Addendum)
Plan A  = Advair 100/ 50 one twice daily smooth deep breath  Plan B =  Only use your albuterol (xopenex)  as a rescue medication to be used if you can't catch your breath by resting or doing a relaxed purse lip breathing pattern. The less you use it, the better it will work when you need it.   Work on inhaler technique:  relax and gently blow all the way out then take a nice smooth deep breath back in, triggering the inhaler at same time you start breathing in.  Hold for up to 5 seconds if you can.  Rinse and gargle with water when done   If your mouth or throat starts to bother you,   I suggest you time the inhaler to your dental care and after using the inhaler(s) brush teeth and tongue with a baking soda containing toothpaste and when you rinse this out, gargle with it first to see if this helps your mouth and throat.     Please schedule a follow up office visit in 6 weeks, call sooner if needed

## 2013-02-21 NOTE — Progress Notes (Signed)
Subjective:     Patient ID: [name], female   DOB: 1933/06/03   MRN: [id number]    Primary = Hilliard Clark with [facility]  Brief patient profile:  79 yowf  mininimal smoking hx with dx of asthma/ past hx polyps but no asa sensitity  First eval in Pulmonary clinic in 2002  05/07/2011 Initial pulmonary office eval in EMR era cc sev months sob/ chest tightness perceived improvement on  primatene and fllonase whereas previously needed nothing main problem occurs typically after supper or after going to sleep with subjective wheezing and nasal congestion plus sob with ex which is new. Cough dry.  rec Start Advair 100/50  One twice daily but work on technique  I emphasized that nasal steroids(flutisone)  have no immediate benefit in terms of improving symptoms.  To help them reached the target tissue, the patient should use Afrin two puffs every 12 hours applied one min before using the nasal steroids.   GERD (REFLUX)  Diet reviewed    06/06/2011 ov/Chong Wojdyla  Cc sob better with no need for primatene since started advair. No cough. rec No change rx    09/08/2011 f/u ov/Karri Kallenbach cc breathing no cough or sob.  Watery rhinitis q am x years is her only complaint, no change with seasons or weather or exp. rec Continue advai 100 and use proaire prn and treat sinuses with rhnitis   02/21/2013 f/u ov/Raylin Winer cc did fine advair 100 rare need for proair but ran out 3 weeks prior to OV then worse cough and sense of chest congestion but no purlent sputum, more doe but no resting symptoms.   No obvious daytime variabilty or assoc   cp or chest tightness, subjective wheeze overt sinus or hb symptoms. No unusual exp hx  .   Sleeping ok without nocturnal  or early am exacerbation  of respiratory  c/o's or need for noct saba. Also denies any obvious fluctuation of symptoms with weather or environmental changes or other aggravating or alleviating factors except as outlined above   ROS  The following are  not active complaints unless bolded sore throat, dysphagia, dental problems, itching, sneezing,  nasal congestion or excess/ purulent secretions, ear ache,   fever, chills, sweats, unintended wt loss, pleuritic or exertional cp, hemoptysis,  orthopnea pnd or leg swelling, presyncope, palpitations, heartburn, abdominal pain, anorexia, nausea, vomiting, diarrhea  or change in bowel or urinary habits, change in stools or urine, dysuria,hematuria,  rash, arthralgias, visual complaints, headache, numbness weakness or ataxia or problems with walking or coordination,  change in mood/affect or memory.                    Objective:   Physical Exam  amb wf nad   Wt 122 05/07/11  > 122 06/06/2011 > 124 09/08/2011 >  02/21/2013 119  HEENT: nl dentition,and orophanx. L nasal polyp Nl external ear canals without cough reflex   NECK :  without JVD/Nodes/TM/ nl carotid upstrokes bilaterally   LUNGS: no acc muscle use, clear to A and P bilaterally without cough on insp or exp maneuvers   CV:  RRR  no s3 or murmur or increase in P2, no edema   ABD:  soft and nontender with nl excursion in the supine position. No bruits or organomegaly, bowel sounds nl  MS:  warm without deformities, calf tenderness, cyanosis or clubbing       cxr 06/06/11 Hyperinflation without acute superimposed process  Assessment:  Plan:

## 2013-02-21 NOTE — Assessment & Plan Note (Addendum)
DDX of  difficult airways managment all start with A and  include Adherence, Ace Inhibitors, Acid Reflux, Active Sinus Disease, Alpha 1 Antitripsin deficiency, Anxiety masquerading as Airways dz,  ABPA,  allergy(esp in young), Aspiration (esp in elderly), Adverse effects of DPI,  Active smokers, plus two Bs  = Bronchiectasis and Beta blocker use..and one C= CHF   Adherence is always the initial "prime suspect" and is a multilayered concern that requires a "trust but verify" approach in every patient - starting with knowing how to use medications, especially inhalers, correctly, keeping up with refills and understanding the fundamental difference between maintenance and prns vs those medications only taken for a very short course and then stopped and not refilled. The proper method of use, as well as anticipated side effects, of a metered-dose inhaler are discussed and demonstrated to the patient. Improved effectiveness after extensive coaching during this visit to a level of approximately  50% with mdi and 90% with dpi so rec restart advair 100 bid and just use the albuterol prn with option of changing completely over to maint laba/ics with neb performist/bud and prn albuterol all per NEB  ? Acid or nonacid reflux > diet reviewed, avoid oil based vitamins when having active resp symptoms    Each maintenance medication was reviewed in detail including most importantly the difference between maintenance and as needed and under what circumstances the prns are to be used.  Please see instructions for details which were reviewed in writing and the patient given a copy.

## 2013-02-28 ENCOUNTER — Ambulatory Visit: Payer: Medicare Other | Admitting: Internal Medicine

## 2013-04-01 ENCOUNTER — Encounter: Payer: Self-pay | Admitting: Internal Medicine

## 2013-04-01 ENCOUNTER — Ambulatory Visit (INDEPENDENT_AMBULATORY_CARE_PROVIDER_SITE_OTHER): Payer: Medicare Other | Admitting: Internal Medicine

## 2013-04-01 VITALS — BP 114/66 | HR 69 | Temp 97.9°F | Ht 64.0 in | Wt 119.0 lb

## 2013-04-01 DIAGNOSIS — J45909 Unspecified asthma, uncomplicated: Secondary | ICD-10-CM

## 2013-04-01 MED ORDER — FLUTICASONE-SALMETEROL 100-50 MCG/DOSE IN AEPB: 1.0000 | INHALATION_SPRAY | Freq: Two times a day (BID) | RESPIRATORY_TRACT | Status: DC

## 2013-04-01 NOTE — Patient Instructions (Addendum)
Plan A  = Advair 100/ 50 one twice daily smooth deep breath - ok to leave off am dose if breathing is good but heart rate is fast  Plan B =  Only use your albuterol (proaire)  as a rescue medication to be used if you can't catch your breath by resting or doing a relaxed purse lip breathing pattern. The less you use it, the better it will work when you need it.   If lopressor works great no problem, if not next best choice bisoprolol because it's not as likely to cause you to wheeze at high doses     If you are satisfied with your treatment plan let your doctor know and he/she can either refill your medications or you can return here when your prescription runs out.     If in any way you are not 100% satisfied,  please tell us.  If 100% better, tell your friends!

## 2013-04-01 NOTE — Progress Notes (Signed)
Subjective:     Patient ID: [name], female   DOB: Jul 14, 1933   MRN: [id number]    Primary = [name] with [facility]  Brief patient profile:  79 yowf  mininimal smoking hx with dx of asthma/ past hx polyps but no asa sensitity  First eval in Pulmonary clinic in 2002  05/07/2011 Initial pulmonary office eval in EMR era cc sev months sob/ chest tightness perceived improvement on  primatene and fllonase whereas previously needed nothing main problem occurs typically after supper or after going to sleep with subjective wheezing and nasal congestion plus sob with ex which is new. Cough dry.  rec Start Advair 100/50  One twice daily but work on technique I emphasized that nasal steroids(flutisone)  have no immediate benefit in terms of improving symptoms.  To help them reached the target tissue, the patient should use Afrin two puffs every 12 hours applied one min before using the nasal steroids.   GERD (REFLUX)  Diet reviewed      02/21/2013 f/u ov/[name] cc did fine advair 100 rare need for proair but ran out 3 weeks prior to OV then worse cough and sense of chest congestion but no purlent sputum, more doe but no resting symptoms. rec advair 100 bid, flonase and prn proaire   04/01/2013 f/u ov/[name] f/u asthma Chief Complaint  Patient presents with  . Follow-up    Cough has resolved. Breathing back at baseline. No c/o's today.  not limited by breathing, not able to push  mow but otherwise does the yardwork, some walking around the neighborhood and mall. No need for albuterol at all.  No obvious daytime variabilty or assoc chronic cough or cp or chest tightness, subjective wheeze overt sinus or hb symptoms. No unusual exp hx or h/o childhood pna/ asthma or premature birth to her knowledge. .   Sleeping ok without nocturnal  or early am exacerbation  of respiratory  c/o's or need for noct saba. Also denies any obvious fluctuation of symptoms with weather or environmental  changes or other aggravating or alleviating factors except as outlined above    .Current Medications, Allergies, Past Medical History, Past Surgical History, Family History, and Social History were reviewed in [employer] record.  ROS  The following are not active complaints unless bolded sore throat, dysphagia, dental problems, itching, sneezing,  nasal congestion or excess/ purulent secretions, ear ache,   fever, chills, sweats, unintended wt loss, pleuritic or exertional cp, hemoptysis,  orthopnea pnd or leg swelling, presyncope, palpitations, heartburn, abdominal pain, anorexia, nausea, vomiting, diarrhea  or change in bowel or urinary habits, change in stools or urine, dysuria,hematuria,  rash, arthralgias, visual complaints, headache, numbness weakness or ataxia or problems with walking or coordination,  change in mood/affect or memory.                        Objective:   Physical Exam  amb wf nad   Wt 122 05/07/11  > 122 06/06/2011 > 124 09/08/2011 >  02/21/2013 119 >  04/01/2013  199  HEENT: nl dentition,and orophanx. L nasal polyp Nl external ear canals without cough reflex   NECK :  without JVD/Nodes/TM/ nl carotid upstrokes bilaterally   LUNGS: no acc muscle use, clear to A and P bilaterally without cough on insp or exp maneuvers   CV:  RRR  no s3 or murmur or increase in P2, no edema   ABD:  soft and nontender  with nl excursion in the supine position. No bruits or organomegaly, bowel sounds nl  MS:  warm without deformities, calf tenderness, cyanosis or clubbing       cxr 06/06/11 Hyperinflation without acute superimposed process  Assessment:         Plan:

## 2013-04-05 NOTE — Assessment & Plan Note (Addendum)
I had an extended discussion with the patient today lasting 15 to 20 minutes of a 25 minute visit on the following issues:   All goals of chronic asthma control met including optimal function and elimination of symptoms with minimal need for rescue therapy.  Contingencies discussed in full including contacting this office immediately if not controlling the symptoms using the rule of two's.     The proper method of use, as well as anticipated side effects, of a DPI  are discussed and demonstrated to the patient. Improved effectiveness after extensive coaching during this visit to a level of approximately  90%  Pulmonary f/u can be prn

## 2014-07-17 ENCOUNTER — Other Ambulatory Visit: Payer: Self-pay | Admitting: Internal Medicine

## 2014-08-04 ENCOUNTER — Encounter: Payer: Self-pay | Admitting: Internal Medicine

## 2015-01-05 DIAGNOSIS — E039 Hypothyroidism, unspecified: Secondary | ICD-10-CM | POA: Diagnosis not present

## 2015-02-19 DIAGNOSIS — E780 Pure hypercholesterolemia: Secondary | ICD-10-CM | POA: Diagnosis not present

## 2015-02-19 DIAGNOSIS — E039 Hypothyroidism, unspecified: Secondary | ICD-10-CM | POA: Diagnosis not present

## 2015-02-19 DIAGNOSIS — I10 Essential (primary) hypertension: Secondary | ICD-10-CM | POA: Diagnosis not present

## 2015-02-28 DIAGNOSIS — J4520 Mild intermittent asthma, uncomplicated: Secondary | ICD-10-CM | POA: Diagnosis not present

## 2015-02-28 DIAGNOSIS — E039 Hypothyroidism, unspecified: Secondary | ICD-10-CM | POA: Diagnosis not present

## 2015-02-28 DIAGNOSIS — I959 Hypotension, unspecified: Secondary | ICD-10-CM | POA: Diagnosis not present

## 2015-04-11 DIAGNOSIS — L237 Allergic contact dermatitis due to plants, except food: Secondary | ICD-10-CM | POA: Diagnosis not present

## 2015-04-25 ENCOUNTER — Encounter: Payer: Self-pay | Admitting: Nurse Practitioner

## 2015-04-25 NOTE — Progress Notes (Signed)
This encounter was created in error - please disregard.

## 2015-04-26 DIAGNOSIS — E039 Hypothyroidism, unspecified: Secondary | ICD-10-CM | POA: Diagnosis not present

## 2015-04-26 DIAGNOSIS — I959 Hypotension, unspecified: Secondary | ICD-10-CM | POA: Diagnosis not present

## 2015-04-30 DIAGNOSIS — I10 Essential (primary) hypertension: Secondary | ICD-10-CM | POA: Diagnosis not present

## 2015-04-30 DIAGNOSIS — E039 Hypothyroidism, unspecified: Secondary | ICD-10-CM | POA: Diagnosis not present

## 2015-09-18 DIAGNOSIS — H25013 Cortical age-related cataract, bilateral: Secondary | ICD-10-CM | POA: Diagnosis not present

## 2015-10-16 DIAGNOSIS — E559 Vitamin D deficiency, unspecified: Secondary | ICD-10-CM | POA: Diagnosis not present

## 2015-10-16 DIAGNOSIS — N390 Urinary tract infection, site not specified: Secondary | ICD-10-CM | POA: Diagnosis not present

## 2015-10-16 DIAGNOSIS — I10 Essential (primary) hypertension: Secondary | ICD-10-CM | POA: Diagnosis not present

## 2015-10-23 DIAGNOSIS — E039 Hypothyroidism, unspecified: Secondary | ICD-10-CM | POA: Diagnosis not present

## 2015-10-23 DIAGNOSIS — J4520 Mild intermittent asthma, uncomplicated: Secondary | ICD-10-CM | POA: Diagnosis not present

## 2015-10-23 DIAGNOSIS — E559 Vitamin D deficiency, unspecified: Secondary | ICD-10-CM | POA: Diagnosis not present

## 2015-10-23 DIAGNOSIS — I10 Essential (primary) hypertension: Secondary | ICD-10-CM | POA: Diagnosis not present

## 2016-08-18 ENCOUNTER — Encounter (HOSPITAL_BASED_OUTPATIENT_CLINIC_OR_DEPARTMENT_OTHER): Payer: Self-pay | Admitting: *Deleted

## 2016-08-18 ENCOUNTER — Other Ambulatory Visit: Payer: Self-pay | Admitting: Orthopedic Surgery

## 2016-08-19 ENCOUNTER — Other Ambulatory Visit: Payer: Self-pay

## 2016-08-19 ENCOUNTER — Encounter (HOSPITAL_BASED_OUTPATIENT_CLINIC_OR_DEPARTMENT_OTHER)
Admission: RE | Admit: 2016-08-19 | Discharge: 2016-08-19 | Disposition: A | Discharge status: Home / Self Care | Payer: Medicare Other | Source: Ambulatory Visit | Attending: Orthopedic Surgery | Admitting: Orthopedic Surgery

## 2016-08-19 DIAGNOSIS — Z853 Personal history of malignant neoplasm of breast: Secondary | ICD-10-CM | POA: Diagnosis not present

## 2016-08-19 DIAGNOSIS — I499 Cardiac arrhythmia, unspecified: Secondary | ICD-10-CM | POA: Diagnosis not present

## 2016-08-19 DIAGNOSIS — Z85038 Personal history of other malignant neoplasm of large intestine: Secondary | ICD-10-CM | POA: Diagnosis not present

## 2016-08-19 DIAGNOSIS — J45909 Unspecified asthma, uncomplicated: Secondary | ICD-10-CM | POA: Diagnosis not present

## 2016-08-19 DIAGNOSIS — W1830XA Fall on same level, unspecified, initial encounter: Secondary | ICD-10-CM | POA: Diagnosis not present

## 2016-08-19 DIAGNOSIS — Y92008 Other place in unspecified non-institutional (private) residence as the place of occurrence of the external cause: Secondary | ICD-10-CM | POA: Diagnosis not present

## 2016-08-19 DIAGNOSIS — Y939 Activity, unspecified: Secondary | ICD-10-CM | POA: Diagnosis not present

## 2016-08-19 DIAGNOSIS — Z9011 Acquired absence of right breast and nipple: Secondary | ICD-10-CM | POA: Diagnosis not present

## 2016-08-19 DIAGNOSIS — Z87891 Personal history of nicotine dependence: Secondary | ICD-10-CM | POA: Diagnosis not present

## 2016-08-19 DIAGNOSIS — Z825 Family history of asthma and other chronic lower respiratory diseases: Secondary | ICD-10-CM | POA: Diagnosis not present

## 2016-08-19 DIAGNOSIS — S52571A Other intraarticular fracture of lower end of right radius, initial encounter for closed fracture: Secondary | ICD-10-CM | POA: Diagnosis present

## 2016-08-19 DIAGNOSIS — E039 Hypothyroidism, unspecified: Secondary | ICD-10-CM | POA: Diagnosis not present

## 2016-08-19 DIAGNOSIS — I10 Essential (primary) hypertension: Secondary | ICD-10-CM | POA: Diagnosis not present

## 2016-08-19 DIAGNOSIS — M1990 Unspecified osteoarthritis, unspecified site: Secondary | ICD-10-CM | POA: Diagnosis not present

## 2016-08-19 DIAGNOSIS — Z9049 Acquired absence of other specified parts of digestive tract: Secondary | ICD-10-CM | POA: Diagnosis not present

## 2016-08-19 DIAGNOSIS — Z8051 Family history of malignant neoplasm of kidney: Secondary | ICD-10-CM | POA: Diagnosis not present

## 2016-08-19 LAB — BASIC METABOLIC PANEL
ANION GAP: 8 (ref 5–15)
BUN: 20 mg/dL (ref 6–20)
CHLORIDE: 98 mmol/L — AB (ref 101–111)
CO2: 27 mmol/L (ref 22–32)
Calcium: 9.6 mg/dL (ref 8.9–10.3)
Creatinine, Ser: 1.06 mg/dL — ABNORMAL HIGH (ref 0.44–1.00)
GFR calc non Af Amer: 48 mL/min — ABNORMAL LOW (ref >60)
GFR, EST AFRICAN AMERICAN: 56 mL/min — AB (ref >60)
Glucose, Bld: 85 mg/dL (ref 65–99)
Potassium: 4.9 mmol/L (ref 3.5–5.1)
Sodium: 133 mmol/L — ABNORMAL LOW (ref 135–145)

## 2016-08-21 ENCOUNTER — Encounter (HOSPITAL_BASED_OUTPATIENT_CLINIC_OR_DEPARTMENT_OTHER)
Admission: RE | Disposition: A | Discharge status: Home / Self Care | Payer: Self-pay | Source: Ambulatory Visit | Attending: Orthopedic Surgery

## 2016-08-21 ENCOUNTER — Ambulatory Visit (HOSPITAL_BASED_OUTPATIENT_CLINIC_OR_DEPARTMENT_OTHER)
Admission: RE | Admit: 2016-08-21 | Discharge: 2016-08-21 | Disposition: A | Discharge status: Home / Self Care | Payer: Medicare Other | Source: Ambulatory Visit | Attending: Orthopedic Surgery | Admitting: Orthopedic Surgery

## 2016-08-21 ENCOUNTER — Encounter (HOSPITAL_BASED_OUTPATIENT_CLINIC_OR_DEPARTMENT_OTHER): Payer: Self-pay | Admitting: Anesthesiology

## 2016-08-21 ENCOUNTER — Ambulatory Visit (HOSPITAL_BASED_OUTPATIENT_CLINIC_OR_DEPARTMENT_OTHER): Payer: Medicare Other | Admitting: Anesthesiology

## 2016-08-21 DIAGNOSIS — E039 Hypothyroidism, unspecified: Secondary | ICD-10-CM | POA: Insufficient documentation

## 2016-08-21 DIAGNOSIS — J45909 Unspecified asthma, uncomplicated: Secondary | ICD-10-CM | POA: Insufficient documentation

## 2016-08-21 DIAGNOSIS — Y92008 Other place in unspecified non-institutional (private) residence as the place of occurrence of the external cause: Secondary | ICD-10-CM | POA: Insufficient documentation

## 2016-08-21 DIAGNOSIS — S52571A Other intraarticular fracture of lower end of right radius, initial encounter for closed fracture: Secondary | ICD-10-CM | POA: Diagnosis not present

## 2016-08-21 DIAGNOSIS — Z8051 Family history of malignant neoplasm of kidney: Secondary | ICD-10-CM | POA: Insufficient documentation

## 2016-08-21 DIAGNOSIS — W1830XA Fall on same level, unspecified, initial encounter: Secondary | ICD-10-CM | POA: Insufficient documentation

## 2016-08-21 DIAGNOSIS — Z853 Personal history of malignant neoplasm of breast: Secondary | ICD-10-CM | POA: Insufficient documentation

## 2016-08-21 DIAGNOSIS — Y939 Activity, unspecified: Secondary | ICD-10-CM | POA: Insufficient documentation

## 2016-08-21 DIAGNOSIS — M1990 Unspecified osteoarthritis, unspecified site: Secondary | ICD-10-CM | POA: Insufficient documentation

## 2016-08-21 DIAGNOSIS — Z825 Family history of asthma and other chronic lower respiratory diseases: Secondary | ICD-10-CM | POA: Insufficient documentation

## 2016-08-21 DIAGNOSIS — Z9011 Acquired absence of right breast and nipple: Secondary | ICD-10-CM | POA: Insufficient documentation

## 2016-08-21 DIAGNOSIS — I499 Cardiac arrhythmia, unspecified: Secondary | ICD-10-CM | POA: Insufficient documentation

## 2016-08-21 DIAGNOSIS — Z87891 Personal history of nicotine dependence: Secondary | ICD-10-CM | POA: Insufficient documentation

## 2016-08-21 DIAGNOSIS — I10 Essential (primary) hypertension: Secondary | ICD-10-CM | POA: Insufficient documentation

## 2016-08-21 DIAGNOSIS — Z9049 Acquired absence of other specified parts of digestive tract: Secondary | ICD-10-CM | POA: Insufficient documentation

## 2016-08-21 DIAGNOSIS — Z85038 Personal history of other malignant neoplasm of large intestine: Secondary | ICD-10-CM | POA: Insufficient documentation

## 2016-08-21 HISTORY — PX: OPEN REDUCTION INTERNAL FIXATION (ORIF) DISTAL PHALANX: SHX6236

## 2016-08-21 HISTORY — DX: Cardiac arrhythmia, unspecified: I49.9

## 2016-08-21 SURGERY — OPEN REDUCTION INTERNAL FIXATION (ORIF) DISTAL PHALANX
Anesthesia: Regional | Site: Wrist | Laterality: Right

## 2016-08-21 MED ORDER — DEXAMETHASONE SODIUM PHOSPHATE 10 MG/ML IJ SOLN
INTRAMUSCULAR | Status: DC | PRN
  Administered 2016-08-21: 10 mg via INTRAVENOUS

## 2016-08-21 MED ORDER — FENTANYL CITRATE (PF) 100 MCG/2ML IJ SOLN
50.0000 ug | INTRAMUSCULAR | Status: DC | PRN
  Administered 2016-08-21: 25 ug via INTRAVENOUS

## 2016-08-21 MED ORDER — ONDANSETRON HCL 4 MG/2ML IJ SOLN
INTRAMUSCULAR | Status: DC | PRN
  Administered 2016-08-21: 4 mg via INTRAVENOUS

## 2016-08-21 MED ORDER — CEFAZOLIN SODIUM-DEXTROSE 2-4 GM/100ML-% IV SOLN
INTRAVENOUS | Status: AC
  Filled 2016-08-21: qty 100

## 2016-08-21 MED ORDER — GLYCOPYRROLATE 0.2 MG/ML IJ SOLN: 0.2000 mg | Freq: Once | INTRAMUSCULAR | Status: DC | PRN

## 2016-08-21 MED ORDER — MIDAZOLAM HCL 2 MG/2ML IJ SOLN
INTRAMUSCULAR | Status: AC
  Filled 2016-08-21: qty 2

## 2016-08-21 MED ORDER — FENTANYL CITRATE (PF) 100 MCG/2ML IJ SOLN: 25.0000 ug | INTRAMUSCULAR | Status: DC | PRN

## 2016-08-21 MED ORDER — HYDROCODONE-ACETAMINOPHEN 5-325 MG PO TABS: ORAL_TABLET | ORAL | 0 refills | Status: DC

## 2016-08-21 MED ORDER — PROPOFOL 10 MG/ML IV BOLUS
INTRAVENOUS | Status: DC | PRN
  Administered 2016-08-21: 100 mg via INTRAVENOUS

## 2016-08-21 MED ORDER — SCOPOLAMINE 1 MG/3DAYS TD PT72: 1.0000 | MEDICATED_PATCH | Freq: Once | TRANSDERMAL | Status: DC | PRN

## 2016-08-21 MED ORDER — ONDANSETRON HCL 4 MG/2ML IJ SOLN
INTRAMUSCULAR | Status: AC
  Filled 2016-08-21: qty 2

## 2016-08-21 MED ORDER — ONDANSETRON HCL 4 MG/2ML IJ SOLN: 4.0000 mg | Freq: Once | INTRAMUSCULAR | Status: DC | PRN

## 2016-08-21 MED ORDER — ROPIVACAINE HCL 7.5 MG/ML IJ SOLN
INTRAMUSCULAR | Status: DC | PRN
  Administered 2016-08-21: 20 mL via PERINEURAL

## 2016-08-21 MED ORDER — FENTANYL CITRATE (PF) 100 MCG/2ML IJ SOLN
INTRAMUSCULAR | Status: AC
  Filled 2016-08-21: qty 2

## 2016-08-21 MED ORDER — LIDOCAINE 2% (20 MG/ML) 5 ML SYRINGE
INTRAMUSCULAR | Status: AC
  Filled 2016-08-21: qty 5

## 2016-08-21 MED ORDER — CHLORHEXIDINE GLUCONATE 4 % EX LIQD: 60.0000 mL | Freq: Once | CUTANEOUS | Status: DC

## 2016-08-21 MED ORDER — PROPOFOL 10 MG/ML IV BOLUS
INTRAVENOUS | Status: AC
  Filled 2016-08-21: qty 20

## 2016-08-21 MED ORDER — LACTATED RINGERS IV SOLN
INTRAVENOUS | Status: DC
  Administered 2016-08-21: 10 mL/h via INTRAVENOUS
  Administered 2016-08-21: 12:00:00 via INTRAVENOUS

## 2016-08-21 MED ORDER — MIDAZOLAM HCL 2 MG/2ML IJ SOLN: 1.0000 mg | INTRAMUSCULAR | Status: DC | PRN

## 2016-08-21 MED ORDER — LIDOCAINE HCL (CARDIAC) 20 MG/ML IV SOLN
INTRAVENOUS | Status: DC | PRN
  Administered 2016-08-21: 30 mg via INTRAVENOUS

## 2016-08-21 MED ORDER — CEFAZOLIN SODIUM-DEXTROSE 2-4 GM/100ML-% IV SOLN
2.0000 g | INTRAVENOUS | Status: AC
  Administered 2016-08-21: 2 g via INTRAVENOUS

## 2016-08-21 SURGICAL SUPPLY — 69 items
BANDAGE ACE 3X5.8 VEL STRL LF (GAUZE/BANDAGES/DRESSINGS) ×2 IMPLANT
BIT DRILL 2.0 LNG QUCK RELEASE (BIT) IMPLANT
BIT DRILL 2.8X5 QR DISP (BIT) ×2 IMPLANT
BLADE MINI RND TIP GREEN BEAV (BLADE) IMPLANT
BLADE SURG 15 STRL LF DISP TIS (BLADE) ×2 IMPLANT
BLADE SURG 15 STRL SS (BLADE) ×6
BNDG CMPR 9X4 STRL LF SNTH (GAUZE/BANDAGES/DRESSINGS) ×1
BNDG ELASTIC 2X5.8 VLCR STR LF (GAUZE/BANDAGES/DRESSINGS) IMPLANT
BNDG ESMARK 4X9 LF (GAUZE/BANDAGES/DRESSINGS) ×3 IMPLANT
BNDG GAUZE ELAST 4 BULKY (GAUZE/BANDAGES/DRESSINGS) ×3 IMPLANT
CHLORAPREP W/TINT 26ML (MISCELLANEOUS) ×3 IMPLANT
CORDS BIPOLAR (ELECTRODE) ×3 IMPLANT
COVER BACK TABLE 60X90IN (DRAPES) ×3 IMPLANT
COVER MAYO STAND STRL (DRAPES) ×3 IMPLANT
CUFF TOURNIQUET SINGLE 18IN (TOURNIQUET CUFF) ×3 IMPLANT
DRAPE EXTREMITY T 121X128X90 (DRAPE) ×3 IMPLANT
DRAPE OEC MINIVIEW 54X84 (DRAPES) ×5 IMPLANT
DRAPE SURG 17X23 STRL (DRAPES) ×3 IMPLANT
DRILL 2.0 LNG QUICK RELEASE (BIT) ×3
GAUZE SPONGE 4X4 12PLY STRL (GAUZE/BANDAGES/DRESSINGS) ×3 IMPLANT
GAUZE XEROFORM 1X8 LF (GAUZE/BANDAGES/DRESSINGS) ×3 IMPLANT
GLOVE BIO SURGEON STRL SZ 6.5 (GLOVE) ×1 IMPLANT
GLOVE BIO SURGEON STRL SZ7 (GLOVE) ×2 IMPLANT
GLOVE BIO SURGEON STRL SZ7.5 (GLOVE) ×3 IMPLANT
GLOVE BIO SURGEONS STRL SZ 6.5 (GLOVE) ×1
GLOVE BIOGEL M STRL SZ7.5 (GLOVE) ×2 IMPLANT
GLOVE BIOGEL PI IND STRL 8 (GLOVE) ×1 IMPLANT
GLOVE BIOGEL PI IND STRL 8.5 (GLOVE) IMPLANT
GLOVE BIOGEL PI INDICATOR 8 (GLOVE) ×4
GLOVE BIOGEL PI INDICATOR 8.5 (GLOVE) ×2
GLOVE SURG ORTHO 8.0 STRL STRW (GLOVE) ×2 IMPLANT
GOWN STRL REUS W/ TWL LRG LVL3 (GOWN DISPOSABLE) ×1 IMPLANT
GOWN STRL REUS W/TWL LRG LVL3 (GOWN DISPOSABLE) ×3
GOWN STRL REUS W/TWL XL LVL3 (GOWN DISPOSABLE) ×5 IMPLANT
GUIDEWIRE ORTHO 0.054X6 (WIRE) ×8 IMPLANT
NDL HYPO 25X1 1.5 SAFETY (NEEDLE) IMPLANT
NEEDLE HYPO 25X1 1.5 SAFETY (NEEDLE) IMPLANT
NS IRRIG 1000ML POUR BTL (IV SOLUTION) ×3 IMPLANT
PACK BASIN DAY SURGERY FS (CUSTOM PROCEDURE TRAY) ×3 IMPLANT
PAD CAST 3X4 CTTN HI CHSV (CAST SUPPLIES) IMPLANT
PAD CAST 4YDX4 CTTN HI CHSV (CAST SUPPLIES) IMPLANT
PADDING CAST COTTON 3X4 STRL (CAST SUPPLIES) ×3
PADDING CAST COTTON 4X4 STRL (CAST SUPPLIES)
PLATE R NARROW PROC VDR (Plate) ×2 IMPLANT
SCREW BN FT 16X2.3XLCK HEX CRT (Screw) IMPLANT
SCREW CORT FX14X2.3XLCK NS (Screw) IMPLANT
SCREW CORTICAL LOCKING 2.3X14M (Screw) ×7 IMPLANT
SCREW CORTICAL LOCKING 2.3X16M (Screw) ×9 IMPLANT
SCREW FX16X2.3XLCK SMTH NS CRT (Screw) IMPLANT
SCREW NON LOCK 3.5X10MM (Screw) ×2 IMPLANT
SCREW NON TOGG 2.3X18MM (Screw) ×2 IMPLANT
SCREW NONLOCK HEX 3.5X12 (Screw) ×4 IMPLANT
SLEEVE SCD COMPRESS KNEE MED (MISCELLANEOUS) ×2 IMPLANT
SLING ARM FOAM STRAP MED (SOFTGOODS) ×2 IMPLANT
SPLINT PLASTER CAST XFAST 3X15 (CAST SUPPLIES) IMPLANT
SPLINT PLASTER CAST XFAST 4X15 (CAST SUPPLIES) IMPLANT
SPLINT PLASTER XTRA FAST SET 4 (CAST SUPPLIES)
SPLINT PLASTER XTRA FASTSET 3X (CAST SUPPLIES) ×20
STOCKINETTE 4X48 STRL (DRAPES) ×3 IMPLANT
SUT ETHILON 3 0 PS 1 (SUTURE) IMPLANT
SUT ETHILON 4 0 PS 2 18 (SUTURE) ×3 IMPLANT
SUT MERSILENE 4 0 P 3 (SUTURE) IMPLANT
SUT VIC AB 3-0 PS1 18 (SUTURE)
SUT VIC AB 3-0 PS1 18XBRD (SUTURE) IMPLANT
SUT VICRYL 4-0 PS2 18IN ABS (SUTURE) ×2 IMPLANT
SYR BULB 3OZ (MISCELLANEOUS) ×3 IMPLANT
SYR CONTROL 10ML LL (SYRINGE) IMPLANT
TOWEL OR 17X24 6PK STRL BLUE (TOWEL DISPOSABLE) ×6 IMPLANT
UNDERPAD 30X30 (UNDERPADS AND DIAPERS) ×3 IMPLANT

## 2016-08-21 NOTE — Transfer of Care (Signed)
Immediate Anesthesia Transfer of Care Note  Patient: [name]  Procedure(s) Performed: Procedure(s): OPEN REDUCTION INTERNAL FIXATION (ORIF) RIGHT RADIUS DISTAL PHALANX (Right)  Patient Location: PACU  Anesthesia Type:GA combined with regional for post-op pain  Level of Consciousness: awake and patient cooperative  Airway & Oxygen Therapy: Patient Spontanous Breathing and Patient connected to face mask oxygen  Post-op Assessment: Report given to RN and Post -op Vital signs reviewed and stable  Post vital signs: Reviewed and stable  Last Vitals:  Vitals:   08/21/16 1255 08/21/16 1256  BP:  (!) 175/64  Pulse: (!) 52 (!) 50  Resp: 15 17  Temp:      Last Pain:  Vitals:   08/21/16 1158  TempSrc: Oral  PainSc:          Complications: No apparent anesthesia complications

## 2016-08-21 NOTE — Anesthesia Preprocedure Evaluation (Addendum)
Anesthesia Evaluation  Patient identified by MRN, date of birth, ID band Patient awake    Reviewed: Allergy & Precautions, NPO status , Patient's Chart, lab work & pertinent test results, reviewed documented beta blocker date and time   History of Anesthesia Complications Negative for: history of anesthetic complications  Airway Mallampati: II  TM Distance: >3 FB Neck ROM: Full    Dental  (+) Teeth Intact, Dental Advisory Given   Pulmonary asthma , former smoker,    Pulmonary exam normal breath sounds clear to auscultation       Cardiovascular hypertension, Pt. on medications and Pt. on home beta blockers Normal cardiovascular exam+ dysrhythmias (tachycardia, on metoprolol; PACs)  Rhythm:Regular Rate:Normal     Neuro/Psych negative neurological ROS  negative psych ROS   GI/Hepatic negative GI ROS, Neg liver ROS, H/o colon cancer s/p resection in 2004   Endo/Other  Hypothyroidism   Renal/GU negative Renal ROS     Musculoskeletal negative musculoskeletal ROS (+) Arthritis ,   Abdominal   Peds  Hematology negative hematology ROS (+)   Anesthesia Other Findings H/o breast cancer, chronic rhinitis   Reproductive/Obstetrics                            Anesthesia Physical Anesthesia Plan  ASA: III  Anesthesia Plan: General and Regional   Post-op Pain Management: GA combined w/ Regional for post-op pain   Induction: Intravenous  Airway Management Planned: LMA  Additional Equipment:   Intra-op Plan:   Post-operative Plan: Extubation in OR  Informed Consent:   Dental advisory given  Plan Discussed with: CRNA  Anesthesia Plan Comments:         Anesthesia Quick Evaluation

## 2016-08-21 NOTE — Anesthesia Postprocedure Evaluation (Signed)
Anesthesia Post Note  Patient: [name]  Procedure(s) Performed: Procedure(s) (LRB): OPEN REDUCTION INTERNAL FIXATION (ORIF) RIGHT RADIUS DISTAL PHALANX (Right)  Patient location during evaluation: PACU Anesthesia Type: General and Regional Level of consciousness: awake and alert Pain management: pain level controlled Vital Signs Assessment: post-procedure vital signs reviewed and stable Respiratory status: spontaneous breathing, nonlabored ventilation, respiratory function stable and patient connected to nasal cannula oxygen Cardiovascular status: blood pressure returned to baseline and stable Postop Assessment: no signs of nausea or vomiting Anesthetic complications: no    Last Vitals:  Vitals:   08/21/16 1530 08/21/16 1545  BP: (!) 143/94 (!) 144/66  Pulse: (!) 59 60  Resp: 15 15  Temp:      Last Pain:  Vitals:   08/21/16 1545  TempSrc:   PainSc: 0-No pain                 Effie Berkshire

## 2016-08-21 NOTE — Op Note (Signed)
I assisted Surgeon(s) and Role:    * Leanora Cover, MD - Primary    * Daryll Brod, MD - Assisting on the Procedure(s): OPEN REDUCTION INTERNAL FIXATION (ORIF) RIGHT RADIUS DISTAL PHALANX on 08/21/2016.  I provided assistance on this case as follows: approach, retraction. Fracture manipulation, reduction and fixation, wound closure and dressing and splint application. I was present for the entire case.  Electronically signed by: Wynonia Sours, MD Date: 08/21/2016 Time: 3:11 PM

## 2016-08-21 NOTE — H&P (Signed)
[name] is an 82 y.o. female.   Chief Complaint: right distal radius fracture HPI: 82 yo rhd female states she fell on her back deck 1 week ago injuring her right wrist.  Seen by PCP and referred for evaluation.  XR revealed right distal radius fracture.  She wishes to undergo operative fixation of the fracture.    Allergies: No Known Allergies  Past Medical History:  Diagnosis Date  . Allergy   . Arthritis   . Asthma   . Blood transfusion 2004  . Breast cancer ([place])    1984.  right mastectomy  . Colon cancer ([place])    2004  . Dysrhythmia    racing heartbeats at times, takes metoprolol  . Hypertension   . Hypothyroidism     Past Surgical History:  Procedure Laterality Date  . COLON RESECTION  2004  . COLON SURGERY     rt hemicolectomy for Ca  . MASTECTOMY     rt  . NASAL POLYP EXCISION    . VESICOVAGINAL FISTULA CLOSURE W/ TAH      Family History: Family History  Problem Relation Age of Onset  . Allergies Mother   . Asthma Mother   . Kidney cancer Mother   . Colon cancer Neg Hx   . Stomach cancer Neg Hx     Social History:   reports that she quit smoking about 33 years ago. Her smoking use included Cigarettes. She quit after 1.00 year of use. She has never used smokeless tobacco. She reports that she drinks about 3.5 oz of alcohol per week . She reports that she does not use drugs.  Medications: No prescriptions prior to admission.    Results for orders placed or performed during the hospital encounter of 08/21/16 (from the past 48 hour(s))  Basic metabolic panel     Status: Abnormal   Collection Time: 08/19/16 11:25 AM  Result Value Ref Range   Sodium 133 (L) 135 - 145 mmol/L   Potassium 4.9 3.5 - 5.1 mmol/L   Chloride 98 (L) 101 - 111 mmol/L   CO2 27 22 - 32 mmol/L   Glucose, Bld 85 65 - 99 mg/dL   BUN 20 6 - 20 mg/dL   Creatinine, Ser 1.06 (H) 0.44 - 1.00 mg/dL   Calcium 9.6 8.9 - 10.3 mg/dL   GFR calc non Af Amer 48 (L) >60 mL/min   GFR  calc Af Amer 56 (L) >60 mL/min    Comment: (NOTE) The eGFR has been calculated using the CKD EPI equation. This calculation has not been validated in all clinical situations. eGFR's persistently <60 mL/min signify possible Chronic Kidney Disease.    Anion gap 8 5 - 15    No results found.   A comprehensive review of systems was negative.  Height '5\' 4"'  (1.626 m), weight 51.7 kg (114 lb).  General appearance: alert, cooperative and appears stated age Head: Normocephalic, without obvious abnormality, atraumatic Neck: supple, symmetrical, trachea midline Resp: clear to auscultation bilaterally Cardio: regular rate and rhythm GI: non-tender Extremities: Intact sensation and capillary refill all digits.  +epl/fpl/io.  No wounds.  Pulses: 2+ and symmetric Skin: Skin color, texture, turgor normal. No rashes or lesions Neurologic: Grossly normal Incision/Wound:none  Assessment/Plan Right distal radius fracture.  Plan ORIF right distal radius.  Non operative and operative treatment options were discussed with the patient and patient wishes to proceed with operative treatment. Risks, benefits, and alternatives of surgery were discussed and the patient agrees with  the plan of care.   Jesselyn Rask R 08/21/2016, 9:30 AM

## 2016-08-21 NOTE — Anesthesia Procedure Notes (Signed)
Anesthesia Regional Block:  Supraclavicular block  Pre-Anesthetic Checklist: ,, timeout performed, Correct Patient, Correct Site, Correct Laterality, Correct Procedure, Correct Position, site marked, Risks and benefits discussed,  Surgical consent,  Pre-op evaluation,  At surgeon's request and post-op pain management  Laterality: Right  Prep: chloraprep       Needles:  Injection technique: Single-shot  Needle Type: Echogenic Needle     Needle Length: 9cm 9 cm Needle Gauge: 21 and 21 G    Additional Needles:  Procedures: ultrasound guided (picture in chart) Supraclavicular block Narrative:  Injection made incrementally with aspirations every 5 mL.  Performed by: Personally  Anesthesiologist: Catalina Gravel  Additional Notes: No pain on injection. No increased resistance to injection. Injection made in 5cc increments.  Good needle visualization.  Patient tolerated procedure well.

## 2016-08-21 NOTE — Anesthesia Procedure Notes (Signed)
Procedure Name: LMA Insertion Date/Time: 08/21/2016 2:05 PM Performed by: Caedmon Louque D Pre-anesthesia Checklist: Patient identified, Emergency Drugs available, Suction available and Patient being monitored Patient Re-evaluated:Patient Re-evaluated prior to inductionOxygen Delivery Method: Circle system utilized Preoxygenation: Pre-oxygenation with 100% oxygen Intubation Type: IV induction Ventilation: Mask ventilation without difficulty LMA: LMA inserted LMA Size: 4.0 Number of attempts: 1 Airway Equipment and Method: Bite block Placement Confirmation: positive ETCO2 Tube secured with: Tape Dental Injury: Teeth and Oropharynx as per pre-operative assessment

## 2016-08-21 NOTE — Progress Notes (Signed)
Assisted Dr. Turk with right, ultrasound guided, supraclavicular block. Side rails up, monitors on throughout procedure. See vital signs in flow sheet. Tolerated Procedure well. 

## 2016-08-21 NOTE — Discharge Instructions (Addendum)

## 2016-08-21 NOTE — Brief Op Note (Signed)
08/21/2016  3:11 PM  PATIENT:  [name]  82 y.o. female  PRE-OPERATIVE DIAGNOSIS:  Right Distal Radius Fracture S52.571A  POST-OPERATIVE DIAGNOSIS:  right distal radius fracture  PROCEDURE:  Procedure(s): OPEN REDUCTION INTERNAL FIXATION (ORIF) RIGHT RADIUS DISTAL PHALANX (Right)  SURGEON:  Surgeon(s) and Role:    * Leanora Cover, MD - Primary    * Daryll Brod, MD - Assisting  PHYSICIAN ASSISTANT:   ASSISTANTS: Daryll Brod, MD   ANESTHESIA:   regional and general  EBL:  Total I/O In: 800 [I.V.:800] Out: -   BLOOD ADMINISTERED:none  DRAINS: none   LOCAL MEDICATIONS USED:  NONE  SPECIMEN:  No Specimen  DISPOSITION OF SPECIMEN:  N/A  COUNTS:  YES  TOURNIQUET:   Total Tourniquet Time Documented: Upper Arm (Right) - 47 minutes Total: Upper Arm (Right) - 47 minutes   DICTATION: .Note written in EPIC  PLAN OF CARE: Discharge to home after PACU  PATIENT DISPOSITION:  PACU - hemodynamically stable.

## 2016-08-21 NOTE — Op Note (Addendum)
08/21/2016 [facility]  Operative Note  Pre Op Diagnosis: Right comminuted intraarticular distal radius fracture  Post Op Diagnosis: Right comminuted intraarticular distal radius fracture  Procedure: ORIF Right comminuted intraarticular distal radius fracture 2 intraarticular fragments  Surgeon: Leanora Cover, MD  Assistant: Daryll Brod, MD  Anesthesia: General and Regional  Fluids: Per anesthesia flow sheet  EBL: minimal  Complications: None  Specimen: None  Tourniquet Time:  Total Tourniquet Time Documented: Upper Arm (Right) - 47 minutes Total: Upper Arm (Right) - 47 minutes   Disposition: Stable to PACU  INDICATIONS: INDICATIONS:  [name] is a 82 y.o. female who fell one week ago injuring her right wrist.  She was seen in the office where XR revealed right distal radius fracture.  We discussed nonoperative and operative treatment options.  She wished to proceed with operative fixation.  Risks, benefits, and alternatives of surgery were discussed including the risk of blood loss; infection; damage to nerves, vessels, tendons, ligaments, bone; failure of surgery; need for additional surgery; complications with wound healing; continued pain; nonunion; malunion; stiffness.  We also discussed the possible need for bone graft and the benefits and risks including the possibility of disease transmission.  She voiced understanding of these risks and elected to proceed.   OPERATIVE COURSE:  After being identified preoperatively by myself, the patient and I agreed upon the procedure and site of procedure.  Surgical site was marked.  The risks, benefits and alternatives of the surgery were reviewed and she wished to proceed.  Surgical consent had been signed.  She was given IV Ancef as preoperative antibiotic prophylaxis.  She was transferred to the operating room and placed on the operating room table in supine position with the Right upper extremity on an armboard.  General and Regional anesthesia was induced by the anesthesiologist.  The Right upper extremity was prepped and draped in normal sterile orthopedic fashion.  A surgical pause was performed between the surgeons, anesthesia and operating room staff, and all were in agreement as to the patient, procedure and site of procedure.  Tourniquet at the proximal aspect of the extremity was inflated to 250 mmHg after exsanguination of the limb with an Esmarch bandage.  Standard volar Mallie Mussel approach was used.  The bipolar electrocautery was used to obtain hemostasis.  The superficial and deep portions of the FCR tendon sheath were incised, and the FCR and FPL were swept ulnarly to protect the palmar cutaneous branch of the median nerve.  The brachioradialis was released at the radial side of the radius.  The pronator quadratus was released and elevated with the periosteal elevator.  The fracture site was identified and cleared of soft tissue interposition and hematoma.  It was reduced under direct visualization.  There was intraarticular extension of the fracture creating two intraarticular fragments.   An AcuMed volar distal radial locking plate was selected.  It was secured to the bone with the guidepins.  C-arm was used in AP and lateral projections to ensure appropriate reduction and position of the hardware and adjustments made as necessary.  Standard AO drilling and measuring technique was used.  A single screw was placed in the slotted hole in the shaft of the plate.  The distal holes were filled with locking pegs with the exception of the styloid holes, which were filled with locking screws.  The ulnar sided distal hole was filled with a locking screw to increase hold of the ulnar sided fragment.  The remaining holes in  the shaft of the plate were filled with nonlocking screws.  Good purchase was obtained.  C-arm was used in AP, lateral and oblique projections to ensure appropriate reduction and position of hardware,  which was the case.  There was no intra-articular penetration.  The wound was copiously irrigated with sterile saline.  Pronator quadratus was repaired back over top of the plate using 4-0 Vicryl suture.  Vicryl suture was placed in the subcutaneous tissues in an inverted interrupted fashion and the skin was closed with 4-0 nylon in a horizontal mattress fashion.  There was good pronation and supination of the wrist without crepitance.  The wound was then dressed with sterile Xeroform, 4x4s, and wrapped with a Kerlix bandage.  A volar splint was placed and wrapped with Kerlix and Ace bandage.  Tourniquet was deflated at 47 minutes.  Fingertips were pink with brisk capillary refill after deflation of the tourniquet.  Operative drapes were broken down.  The patient was awoken from anesthesia safely.  He was transferred back to the stretcher and taken to the PACU in stable condition.  I will see him back in the office in one week for postoperative followup.  I will give her a prescription for Norco 5/325 1-2 PO q6 hours prn pain, dispense #30.    Leanora Cover, MD

## 2016-08-22 NOTE — Addendum Note (Signed)
Addendum  created 08/22/16 1259 by Marrianne Mood, CRNA   Charge Capture section accepted

## 2016-08-24 ENCOUNTER — Encounter (HOSPITAL_BASED_OUTPATIENT_CLINIC_OR_DEPARTMENT_OTHER): Payer: Self-pay | Admitting: Orthopedic Surgery

## 2016-09-26 DIAGNOSIS — S52571D Other intraarticular fracture of lower end of right radius, subsequent encounter for closed fracture with routine healing: Secondary | ICD-10-CM | POA: Diagnosis not present

## 2016-10-13 DIAGNOSIS — I10 Essential (primary) hypertension: Secondary | ICD-10-CM | POA: Diagnosis not present

## 2016-10-13 DIAGNOSIS — R300 Dysuria: Secondary | ICD-10-CM | POA: Diagnosis not present

## 2016-10-13 DIAGNOSIS — E039 Hypothyroidism, unspecified: Secondary | ICD-10-CM | POA: Diagnosis not present

## 2016-10-13 DIAGNOSIS — N390 Urinary tract infection, site not specified: Secondary | ICD-10-CM | POA: Diagnosis not present

## 2016-10-27 DIAGNOSIS — E039 Hypothyroidism, unspecified: Secondary | ICD-10-CM | POA: Diagnosis not present

## 2016-10-27 DIAGNOSIS — I10 Essential (primary) hypertension: Secondary | ICD-10-CM | POA: Diagnosis not present

## 2016-10-27 DIAGNOSIS — J4520 Mild intermittent asthma, uncomplicated: Secondary | ICD-10-CM | POA: Diagnosis not present

## 2016-11-10 DIAGNOSIS — S52571D Other intraarticular fracture of lower end of right radius, subsequent encounter for closed fracture with routine healing: Secondary | ICD-10-CM | POA: Diagnosis not present

## 2017-02-04 DIAGNOSIS — H524 Presbyopia: Secondary | ICD-10-CM | POA: Diagnosis not present

## 2017-04-27 DIAGNOSIS — Z0000 Encounter for general adult medical examination without abnormal findings: Secondary | ICD-10-CM | POA: Diagnosis not present

## 2017-04-27 DIAGNOSIS — E039 Hypothyroidism, unspecified: Secondary | ICD-10-CM | POA: Diagnosis not present

## 2017-04-27 DIAGNOSIS — I10 Essential (primary) hypertension: Secondary | ICD-10-CM | POA: Diagnosis not present

## 2017-04-27 DIAGNOSIS — E559 Vitamin D deficiency, unspecified: Secondary | ICD-10-CM | POA: Diagnosis not present

## 2017-04-29 DIAGNOSIS — Z0000 Encounter for general adult medical examination without abnormal findings: Secondary | ICD-10-CM | POA: Diagnosis not present

## 2017-04-29 DIAGNOSIS — E039 Hypothyroidism, unspecified: Secondary | ICD-10-CM | POA: Diagnosis not present

## 2017-04-29 DIAGNOSIS — R319 Hematuria, unspecified: Secondary | ICD-10-CM | POA: Diagnosis not present

## 2017-04-29 DIAGNOSIS — I10 Essential (primary) hypertension: Secondary | ICD-10-CM | POA: Diagnosis not present

## 2017-05-27 ENCOUNTER — Encounter: Payer: Self-pay | Admitting: Gastroenterology

## 2017-06-17 DIAGNOSIS — J329 Chronic sinusitis, unspecified: Secondary | ICD-10-CM | POA: Diagnosis not present

## 2017-10-20 DIAGNOSIS — I10 Essential (primary) hypertension: Secondary | ICD-10-CM | POA: Diagnosis not present

## 2017-10-20 DIAGNOSIS — E559 Vitamin D deficiency, unspecified: Secondary | ICD-10-CM | POA: Diagnosis not present

## 2017-10-20 DIAGNOSIS — E039 Hypothyroidism, unspecified: Secondary | ICD-10-CM | POA: Diagnosis not present

## 2017-10-20 DIAGNOSIS — J45909 Unspecified asthma, uncomplicated: Secondary | ICD-10-CM | POA: Diagnosis not present

## 2017-11-24 DIAGNOSIS — E039 Hypothyroidism, unspecified: Secondary | ICD-10-CM | POA: Diagnosis not present

## 2017-11-24 DIAGNOSIS — I10 Essential (primary) hypertension: Secondary | ICD-10-CM | POA: Diagnosis not present

## 2017-11-24 DIAGNOSIS — E7800 Pure hypercholesterolemia, unspecified: Secondary | ICD-10-CM | POA: Diagnosis not present

## 2017-11-24 DIAGNOSIS — J4520 Mild intermittent asthma, uncomplicated: Secondary | ICD-10-CM | POA: Diagnosis not present

## 2017-11-24 DIAGNOSIS — J45909 Unspecified asthma, uncomplicated: Secondary | ICD-10-CM | POA: Diagnosis not present

## 2018-04-12 DIAGNOSIS — L710 Perioral dermatitis: Secondary | ICD-10-CM | POA: Diagnosis not present

## 2018-04-12 DIAGNOSIS — K130 Diseases of lips: Secondary | ICD-10-CM | POA: Diagnosis not present

## 2018-04-28 DIAGNOSIS — E039 Hypothyroidism, unspecified: Secondary | ICD-10-CM | POA: Diagnosis not present

## 2018-04-28 DIAGNOSIS — I10 Essential (primary) hypertension: Secondary | ICD-10-CM | POA: Diagnosis not present

## 2018-04-28 DIAGNOSIS — E559 Vitamin D deficiency, unspecified: Secondary | ICD-10-CM | POA: Diagnosis not present

## 2018-04-28 DIAGNOSIS — E7800 Pure hypercholesterolemia, unspecified: Secondary | ICD-10-CM | POA: Diagnosis not present

## 2018-04-28 DIAGNOSIS — N390 Urinary tract infection, site not specified: Secondary | ICD-10-CM | POA: Diagnosis not present

## 2018-05-04 DIAGNOSIS — I10 Essential (primary) hypertension: Secondary | ICD-10-CM | POA: Diagnosis not present

## 2018-05-04 DIAGNOSIS — M8580 Other specified disorders of bone density and structure, unspecified site: Secondary | ICD-10-CM | POA: Diagnosis not present

## 2018-05-04 DIAGNOSIS — Z0000 Encounter for general adult medical examination without abnormal findings: Secondary | ICD-10-CM | POA: Diagnosis not present

## 2018-07-06 DIAGNOSIS — R42 Dizziness and giddiness: Secondary | ICD-10-CM | POA: Diagnosis not present

## 2018-07-06 DIAGNOSIS — R002 Palpitations: Secondary | ICD-10-CM | POA: Diagnosis not present

## 2018-07-16 DIAGNOSIS — R002 Palpitations: Secondary | ICD-10-CM | POA: Diagnosis not present

## 2018-07-16 DIAGNOSIS — Z0189 Encounter for other specified special examinations: Secondary | ICD-10-CM | POA: Diagnosis not present

## 2018-07-16 DIAGNOSIS — I10 Essential (primary) hypertension: Secondary | ICD-10-CM | POA: Diagnosis not present

## 2018-07-16 DIAGNOSIS — R42 Dizziness and giddiness: Secondary | ICD-10-CM | POA: Diagnosis not present

## 2018-07-26 DIAGNOSIS — R42 Dizziness and giddiness: Secondary | ICD-10-CM | POA: Diagnosis not present

## 2018-07-26 DIAGNOSIS — J339 Nasal polyp, unspecified: Secondary | ICD-10-CM | POA: Diagnosis not present

## 2018-07-26 DIAGNOSIS — R0981 Nasal congestion: Secondary | ICD-10-CM | POA: Diagnosis not present

## 2018-07-29 ENCOUNTER — Other Ambulatory Visit (HOSPITAL_COMMUNITY): Payer: Self-pay | Admitting: Cardiology

## 2018-07-29 DIAGNOSIS — R42 Dizziness and giddiness: Secondary | ICD-10-CM

## 2018-08-02 ENCOUNTER — Ambulatory Visit (HOSPITAL_COMMUNITY)
Admission: RE | Admit: 2018-08-02 | Discharge: 2018-08-02 | Disposition: A | Discharge status: Home / Self Care | Payer: Medicare Other | Source: Ambulatory Visit | Attending: Internal Medicine | Admitting: Internal Medicine

## 2018-08-02 DIAGNOSIS — I081 Rheumatic disorders of both mitral and tricuspid valves: Secondary | ICD-10-CM | POA: Insufficient documentation

## 2018-08-02 DIAGNOSIS — R42 Dizziness and giddiness: Secondary | ICD-10-CM | POA: Diagnosis not present

## 2018-08-02 NOTE — Progress Notes (Signed)
  Echocardiogram 2D Echocardiogram has been performed.  [name] 08/02/2018, 3:51 PM

## 2018-08-04 DIAGNOSIS — R002 Palpitations: Secondary | ICD-10-CM | POA: Diagnosis not present

## 2018-08-05 DIAGNOSIS — R42 Dizziness and giddiness: Secondary | ICD-10-CM | POA: Diagnosis not present

## 2018-08-05 DIAGNOSIS — I10 Essential (primary) hypertension: Secondary | ICD-10-CM | POA: Diagnosis not present

## 2018-08-05 DIAGNOSIS — I471 Supraventricular tachycardia: Secondary | ICD-10-CM | POA: Diagnosis not present

## 2018-08-05 DIAGNOSIS — R002 Palpitations: Secondary | ICD-10-CM | POA: Diagnosis not present

## 2018-08-16 DIAGNOSIS — I10 Essential (primary) hypertension: Secondary | ICD-10-CM | POA: Diagnosis not present

## 2018-08-16 DIAGNOSIS — R0602 Shortness of breath: Secondary | ICD-10-CM | POA: Diagnosis not present

## 2018-08-16 DIAGNOSIS — I471 Supraventricular tachycardia: Secondary | ICD-10-CM | POA: Diagnosis not present

## 2018-08-16 DIAGNOSIS — R002 Palpitations: Secondary | ICD-10-CM | POA: Diagnosis not present

## 2018-08-18 DIAGNOSIS — H8110 Benign paroxysmal vertigo, unspecified ear: Secondary | ICD-10-CM | POA: Diagnosis not present

## 2018-08-27 DIAGNOSIS — I471 Supraventricular tachycardia: Secondary | ICD-10-CM | POA: Diagnosis not present

## 2018-08-27 DIAGNOSIS — R0609 Other forms of dyspnea: Secondary | ICD-10-CM | POA: Diagnosis not present

## 2018-08-27 DIAGNOSIS — R42 Dizziness and giddiness: Secondary | ICD-10-CM | POA: Diagnosis not present

## 2018-08-27 DIAGNOSIS — I10 Essential (primary) hypertension: Secondary | ICD-10-CM | POA: Diagnosis not present

## 2018-09-06 DIAGNOSIS — J324 Chronic pansinusitis: Secondary | ICD-10-CM | POA: Diagnosis not present

## 2018-09-06 DIAGNOSIS — J329 Chronic sinusitis, unspecified: Secondary | ICD-10-CM | POA: Diagnosis not present

## 2018-09-06 DIAGNOSIS — J330 Polyp of nasal cavity: Secondary | ICD-10-CM | POA: Diagnosis not present

## 2018-09-06 DIAGNOSIS — J339 Nasal polyp, unspecified: Secondary | ICD-10-CM | POA: Diagnosis not present

## 2018-09-06 DIAGNOSIS — R0981 Nasal congestion: Secondary | ICD-10-CM | POA: Diagnosis not present

## 2019-01-01 ENCOUNTER — Other Ambulatory Visit: Payer: Self-pay | Admitting: Cardiology

## 2019-01-01 DIAGNOSIS — I471 Supraventricular tachycardia: Secondary | ICD-10-CM

## 2019-01-01 DIAGNOSIS — I071 Rheumatic tricuspid insufficiency: Secondary | ICD-10-CM

## 2019-01-01 DIAGNOSIS — I340 Nonrheumatic mitral (valve) insufficiency: Secondary | ICD-10-CM

## 2019-01-25 DIAGNOSIS — C44212 Basal cell carcinoma of skin of right ear and external auricular canal: Secondary | ICD-10-CM | POA: Diagnosis not present

## 2019-01-25 DIAGNOSIS — L821 Other seborrheic keratosis: Secondary | ICD-10-CM | POA: Diagnosis not present

## 2019-01-27 ENCOUNTER — Ambulatory Visit: Payer: Medicare Other

## 2019-01-27 DIAGNOSIS — I071 Rheumatic tricuspid insufficiency: Secondary | ICD-10-CM | POA: Diagnosis not present

## 2019-01-27 DIAGNOSIS — I340 Nonrheumatic mitral (valve) insufficiency: Secondary | ICD-10-CM | POA: Diagnosis not present

## 2019-01-27 DIAGNOSIS — I471 Supraventricular tachycardia: Secondary | ICD-10-CM | POA: Diagnosis not present

## 2019-01-29 ENCOUNTER — Emergency Department (HOSPITAL_COMMUNITY)
Admission: EM | Admit: 2019-01-29 | Discharge: 2019-01-29 | Disposition: A | Discharge status: Home / Self Care | Payer: Medicare Other | Attending: Emergency Medicine | Admitting: Emergency Medicine

## 2019-01-29 ENCOUNTER — Emergency Department (HOSPITAL_COMMUNITY): Payer: Medicare Other

## 2019-01-29 ENCOUNTER — Encounter (HOSPITAL_COMMUNITY): Payer: Self-pay | Admitting: Internal Medicine

## 2019-01-29 DIAGNOSIS — Y92013 Bedroom of single-family (private) house as the place of occurrence of the external cause: Secondary | ICD-10-CM | POA: Diagnosis not present

## 2019-01-29 DIAGNOSIS — Y999 Unspecified external cause status: Secondary | ICD-10-CM | POA: Diagnosis not present

## 2019-01-29 DIAGNOSIS — Z85038 Personal history of other malignant neoplasm of large intestine: Secondary | ICD-10-CM | POA: Diagnosis not present

## 2019-01-29 DIAGNOSIS — Y9389 Activity, other specified: Secondary | ICD-10-CM | POA: Diagnosis not present

## 2019-01-29 DIAGNOSIS — W19XXXA Unspecified fall, initial encounter: Secondary | ICD-10-CM

## 2019-01-29 DIAGNOSIS — Z87891 Personal history of nicotine dependence: Secondary | ICD-10-CM | POA: Diagnosis not present

## 2019-01-29 DIAGNOSIS — E039 Hypothyroidism, unspecified: Secondary | ICD-10-CM | POA: Diagnosis not present

## 2019-01-29 DIAGNOSIS — S0990XA Unspecified injury of head, initial encounter: Secondary | ICD-10-CM | POA: Diagnosis not present

## 2019-01-29 DIAGNOSIS — I10 Essential (primary) hypertension: Secondary | ICD-10-CM | POA: Diagnosis not present

## 2019-01-29 DIAGNOSIS — Z853 Personal history of malignant neoplasm of breast: Secondary | ICD-10-CM | POA: Insufficient documentation

## 2019-01-29 DIAGNOSIS — S0101XA Laceration without foreign body of scalp, initial encounter: Secondary | ICD-10-CM

## 2019-01-29 DIAGNOSIS — W1830XA Fall on same level, unspecified, initial encounter: Secondary | ICD-10-CM | POA: Insufficient documentation

## 2019-01-29 DIAGNOSIS — Z23 Encounter for immunization: Secondary | ICD-10-CM | POA: Diagnosis not present

## 2019-01-29 MED ORDER — LIDOCAINE HCL (PF) 1 % IJ SOLN
30.0000 mL | Freq: Once | INTRAMUSCULAR | Status: AC
  Administered 2019-01-29: 30 mL
  Filled 2019-01-29: qty 30

## 2019-01-29 MED ORDER — TETANUS-DIPHTH-ACELL PERTUSSIS 5-2.5-18.5 LF-MCG/0.5 IM SUSP
0.5000 mL | Freq: Once | INTRAMUSCULAR | Status: AC
  Administered 2019-01-29: 0.5 mL via INTRAMUSCULAR
  Filled 2019-01-29: qty 0.5

## 2019-01-29 NOTE — ED Notes (Signed)
Suture cart bedside. 

## 2019-01-29 NOTE — ED Notes (Signed)
Patient transported to CT 

## 2019-01-29 NOTE — ED Notes (Signed)
Patient ambulatory to bathroom with steady gait at this time 

## 2019-01-29 NOTE — Discharge Instructions (Addendum)
You were evaluated in the Emergency Department and after careful evaluation, we did not find any emergent condition requiring admission or further testing in the hospital.  Your symptoms today seem to be due to a laceration from the fall.  As discussed, please follow-up with a healthcare professional to have your stitches removed in 5 to 7 days.  Please return to the Emergency Department if you experience any worsening of your condition.  We encourage you to follow up with a primary care provider.  Thank you for allowing [place] to be a part of your care.

## 2019-01-29 NOTE — ED Notes (Signed)
Patient verbalizes understanding of discharge instructions. Opportunity for questioning and answers were provided. Armband removed by staff, pt discharged from ED.  

## 2019-01-29 NOTE — ED Provider Notes (Signed)
[facility] Emergency Department Provider Note MRN:  [id number]  Arrival date & time: 01/29/19     Chief Complaint   Fall   History of Present Illness   [name] is a 85 y.o. year-old female with a history of hypertension presenting to the ED with chief complaint of fall.  This morning at 7 AM patient believes that she tripped and fell while trying to get into bed.  Denies dizziness or chest pain prior to the fall.  Husband who believes there is a shoe that she tripped on.  Endorsing head trauma and laceration sustained to the scalp behind her left ear.  Seen by urgent care and advised to come to the emergency department for repair.  Denies neck pain, headache, vision change, vomiting, shortness of breath, abdominal pain, no pain or injuries to the arms or legs.  Review of Systems  A complete 10 system review of systems was obtained and all systems are negative except as noted in the HPI and PMH.   Patient's Health History    Past Medical History:  Diagnosis Date  . Allergy   . Arthritis   . Asthma   . Blood transfusion 2004  . Breast cancer ([place])    1984.  right mastectomy  . Colon cancer ([place])    2004  . Dysrhythmia    racing heartbeats at times, takes metoprolol  . Hypertension   . Hypothyroidism     Past Surgical History:  Procedure Laterality Date  . COLON RESECTION  2004  . COLON SURGERY     rt hemicolectomy for Ca  . MASTECTOMY     rt  . NASAL POLYP EXCISION    . OPEN REDUCTION INTERNAL FIXATION (ORIF) DISTAL PHALANX Right 08/21/2016   Procedure: OPEN REDUCTION INTERNAL FIXATION (ORIF) RIGHT RADIUS DISTAL PHALANX;  Surgeon: Leanora Cover, MD;  Location: [place];  Service: Orthopedics;  Laterality: Right;  [name] Kitchen VESICOVAGINAL FISTULA CLOSURE W/ TAH      Family History  Problem Relation Age of Onset  . Allergies Mother   . Asthma Mother   . Kidney cancer Mother   . Colon cancer Neg Hx   . Stomach cancer Neg Hx     Social  History   Socioeconomic History  . Marital status: Married    Spouse name: Not on file  . Number of children: 5  . Years of education: Not on file  . Highest education level: Not on file  Occupational History  . Occupation: Retired from daycare  Social Needs  . Financial resource strain: Not on file  . Food insecurity:    Worry: Not on file    Inability: Not on file  . Transportation needs:    Medical: Not on file    Non-medical: Not on file  Tobacco Use  . Smoking status: Former Smoker    Years: 1.00    Types: Cigarettes    Last attempt to quit: 12/08/1982    Years since quitting: 36.1  . Smokeless tobacco: Never Used  . Tobacco comment: smoked "on and off socially"  Substance and Sexual Activity  . Alcohol use: Yes    Alcohol/week: 7.0 standard drinks    Types: 7 Standard drinks or equivalent per week    Comment: wine  . Drug use: No  . Sexual activity: Not on file  Lifestyle  . Physical activity:    Days per week: Not on file    Minutes per session: Not on file  .  Stress: Not on file  Relationships  . Social connections:    Talks on phone: Not on file    Gets together: Not on file    Attends religious service: Not on file    Active member of club or organization: Not on file    Attends meetings of clubs or organizations: Not on file    Relationship status: Not on file  . Intimate partner violence:    Fear of current or ex partner: Not on file    Emotionally abused: Not on file    Physically abused: Not on file    Forced sexual activity: Not on file  Other Topics Concern  . Not on file  Social History Narrative  . Not on file     Physical Exam  Vital Signs and Nursing Notes reviewed Vitals:   01/29/19 1530 01/29/19 1545  BP: (!) 154/74 (!) 145/79  Pulse: 62 (!) 59  Resp: 18 15  Temp:    SpO2: 91% 95%    CONSTITUTIONAL: Well-appearing, NAD NEURO:  Alert and oriented x 3, no focal deficits EYES:  eyes equal and reactive ENT/NECK:  no LAD, no  JVD CARDIO: Regular rate, well-perfused, normal S1 and S2 PULM:  CTAB no wheezing or rhonchi GI/GU:  normal bowel sounds, non-distended, non-tender MSK/SPINE:  No gross deformities, no edema SKIN:  no rash, 4 cm laceration to the left postauricular scalp with visible periosteum PSYCH:  Appropriate speech and behavior  Diagnostic and Interventional Summary    Labs Reviewed - No data to display  CT Head Wo Contrast  Final Result      Medications  lidocaine (PF) (XYLOCAINE) 1 % injection 30 mL (30 mLs Other Given by Other 01/29/19 1528)  Tdap (BOOSTRIX) injection 0.5 mL (0.5 mLs Intramuscular Given 01/29/19 1420)     .[name] KitchenLaceration Repair Date/Time: 01/29/2019 4:02 PM Performed by: Maudie Flakes, MD Authorized by: Maudie Flakes, MD   Consent:    Consent obtained:  Verbal   Consent given by:  Patient   Risks discussed:  Pain and poor cosmetic result Anesthesia (see MAR for exact dosages):    Anesthesia method:  Local infiltration   Local anesthetic:  Lidocaine 1% w/o epi Laceration details:    Location:  Scalp   Scalp location:  L parietal   Length (cm):  4   Depth (mm):  3 Repair type:    Repair type:  Intermediate Pre-procedure details:    Preparation:  Patient was prepped and draped in usual sterile fashion Exploration:    Hemostasis achieved with:  Direct pressure   Wound exploration: wound explored through full range of motion     Contaminated: no   Treatment:    Area cleansed with:  Saline   Amount of cleaning:  Standard   Irrigation solution:  Sterile saline Fascia repair:    Suture size:  4-0   Suture material:  Vicryl   Suture technique:  Simple interrupted   Number of sutures:  1 Skin repair:    Repair method:  Sutures   Suture size:  4-0   Suture material:  Prolene   Suture technique:  Simple interrupted   Number of sutures:  6 Approximation:    Approximation:  Close Post-procedure details:    Dressing:  Non-adherent dressing   Patient tolerance  of procedure:  Tolerated well, no immediate complications   Critical Care  ED Course and Medical Decision Making  I have reviewed the triage vital signs and the nursing notes.  Pertinent labs & imaging results that were available during my care of the patient were reviewed by me and considered in my medical decision making (see below for details).  Deep scalp laceration that is hemostatic in this 85 year old female with mechanical ground-level fall, not anticoagulated, will update tetanus, CT head to exclude intracranial hemorrhage or skull fracture, multilevel repair will be needed.  CT unremarkable, repaired as described above.  Advised to have sutures removed in 5 to 7 days.  After the discussed management above, the patient was determined to be safe for discharge.  The patient was in agreement with this plan and all questions regarding their care were answered.  ED return precautions were discussed and the patient will return to the ED with any significant worsening of condition.  Victoria Kirks. Sedonia Small, MD [place] [email or web address]  Final Clinical Impressions(s) / ED Diagnoses     ICD-10-CM   1. Laceration of scalp, initial encounter S01.01XA   2. Fall, initial encounter W19.[facility]     ED Discharge Orders    None         Maudie Flakes, MD 01/29/19 [phone]

## 2019-01-29 NOTE — ED Notes (Signed)
ED Provider at bedside. 

## 2019-01-29 NOTE — ED Notes (Signed)
Patient left without receiving paperwork. Pt verbalized understanding of discharge instructions while this RN was in the room with her.

## 2019-01-29 NOTE — ED Triage Notes (Signed)
Pt here for evaluation after she fell this morning attempting to get onto her bed. Husband found pt on floor next to bed shortly after the fall. 4-5cm laceration noted to posterior side of patient's left ear. Denies LOC/blood thinners. A/Ox4 at this time.

## 2019-02-07 DIAGNOSIS — C44212 Basal cell carcinoma of skin of right ear and external auricular canal: Secondary | ICD-10-CM | POA: Diagnosis not present

## 2019-02-25 ENCOUNTER — Ambulatory Visit: Payer: Self-pay | Admitting: Cardiology

## 2019-03-01 ENCOUNTER — Other Ambulatory Visit: Payer: Self-pay | Admitting: Cardiology

## 2019-04-25 ENCOUNTER — Ambulatory Visit: Payer: Self-pay | Admitting: Cardiology

## 2019-04-26 ENCOUNTER — Other Ambulatory Visit: Payer: Self-pay | Admitting: Cardiology

## 2019-04-26 NOTE — Telephone Encounter (Signed)
Please fill

## 2019-06-30 ENCOUNTER — Other Ambulatory Visit: Payer: Self-pay

## 2019-06-30 ENCOUNTER — Ambulatory Visit (INDEPENDENT_AMBULATORY_CARE_PROVIDER_SITE_OTHER): Payer: Medicare Other | Admitting: Cardiology

## 2019-06-30 ENCOUNTER — Encounter: Payer: Self-pay | Admitting: Cardiology

## 2019-06-30 VITALS — BP 105/61 | HR 70 | Ht 64.0 in | Wt 112.0 lb

## 2019-06-30 DIAGNOSIS — I471 Supraventricular tachycardia: Secondary | ICD-10-CM | POA: Insufficient documentation

## 2019-06-30 DIAGNOSIS — I071 Rheumatic tricuspid insufficiency: Secondary | ICD-10-CM | POA: Diagnosis not present

## 2019-06-30 DIAGNOSIS — I351 Nonrheumatic aortic (valve) insufficiency: Secondary | ICD-10-CM

## 2019-06-30 DIAGNOSIS — I272 Pulmonary hypertension, unspecified: Secondary | ICD-10-CM

## 2019-06-30 DIAGNOSIS — I4719 Other supraventricular tachycardia: Secondary | ICD-10-CM | POA: Insufficient documentation

## 2019-06-30 NOTE — Progress Notes (Signed)
Telephone visit note  Subjective:   [name], female    DOB: [birth year], 85 y.o.   MRN: [id number]   I connected with the patient on 06/30/19 by a telephone call and verified that I am speaking with the correct person using two identifiers.     I offered the patient a video enabled application for a virtual visit. Unfortunately, this could not be accomplished due to technical difficulties/lack of video enabled phone/computer. I discussed the limitations of evaluation and management by telemedicine and the availability of in person appointments. The patient expressed understanding and agreed to proceed.   This visit type was conducted due to national recommendations for restrictions regarding the COVID-19 Pandemic (e.g. social distancing).  This format is felt to be most appropriate for this patient at this time.  All issues noted in this document were discussed and addressed.  No physical exam was performed (except for noted visual exam findings with Tele health visits).  The patient has consented to conduct a Tele health visit and understands insurance will be billed.   Chief complaint:  Supraventricular tachycardia   HPI  85 year old Caucasian female with hypertension, hypothyroidism, remote history of colon and breast cancer status post partial colectomy, left mastectomy, chemotherapy, SVT.  Patient has been doing well. Her husband monitors her rhythm/rate on smartwatch, and states that she has not had any rapid heart rate episodes. She denies any chest pain, shortness of breath, leg swelling symptoms.   Past Medical History:  Diagnosis Date  . Allergy   . Arthritis   . Asthma   . Blood transfusion 2004  . Breast cancer ([place])    1984.  right mastectomy  . Colon cancer ([place])    2004  . Dysrhythmia    racing heartbeats at times, takes metoprolol  . Hypertension   . Hypothyroidism      Past Surgical History:  Procedure Laterality Date  . COLON RESECTION  2004  .  COLON SURGERY     rt hemicolectomy for Ca  . MASTECTOMY     rt  . NASAL POLYP EXCISION    . OPEN REDUCTION INTERNAL FIXATION (ORIF) DISTAL PHALANX Right 08/21/2016   Procedure: OPEN REDUCTION INTERNAL FIXATION (ORIF) RIGHT RADIUS DISTAL PHALANX;  Surgeon: Leanora Cover, MD;  Location: [facility];  Service: Orthopedics;  Laterality: Right;  [name] Kitchen VESICOVAGINAL FISTULA CLOSURE W/ TAH       Social History   Socioeconomic History  . Marital status: Married    Spouse name: Not on file  . Number of children: 5  . Years of education: Not on file  . Highest education level: Not on file  Occupational History  . Occupation: Retired from daycare  Social Needs  . Financial resource strain: Not on file  . Food insecurity    Worry: Not on file    Inability: Not on file  . Transportation needs    Medical: Not on file    Non-medical: Not on file  Tobacco Use  . Smoking status: Former Smoker    Years: 1.00    Types: Cigarettes    Quit date: 12/08/1982    Years since quitting: 36.5  . Smokeless tobacco: Never Used  . Tobacco comment: smoked "on and off socially"  Substance and Sexual Activity  . Alcohol use: Yes    Alcohol/week: 7.0 standard drinks    Types: 7 Standard drinks or equivalent per week    Comment: wine  . Drug use: No  . Sexual  activity: Not on file  Lifestyle  . Physical activity    Days per week: Not on file    Minutes per session: Not on file  . Stress: Not on file  Relationships  . Social Herbalist on phone: Not on file    Gets together: Not on file    Attends religious service: Not on file    Active member of club or organization: Not on file    Attends meetings of clubs or organizations: Not on file    Relationship status: Not on file  . Intimate partner violence    Fear of current or ex partner: Not on file    Emotionally abused: Not on file    Physically abused: Not on file    Forced sexual activity: Not on file  Other Topics Concern   . Not on file  Social History Narrative  . Not on file     Family History  Problem Relation Age of Onset  . Allergies Mother   . Asthma Mother   . Kidney cancer Mother   . Colon cancer Neg Hx   . Stomach cancer Neg Hx      Current Outpatient Medications on File Prior to Visit  Medication Sig Dispense Refill  . ADVAIR DISKUS 100-50 MCG/DOSE AEPB USE 1 PUFF BY MOUTH EVERY12 HOURS. (Patient taking differently: Inhale 1 puff into the lungs every 12 (twelve) hours. ) 60 each 0  . albuterol (PROAIR HFA) 108 (90 BASE) MCG/ACT inhaler Inhale 2 puffs into the lungs every 6 (six) hours as needed for wheezing. Needs to schedule an appt for refills. (Patient taking differently: Inhale 2 puffs into the lungs every 6 (six) hours as needed for wheezing. ) 1 Inhaler 0  . cyanocobalamin 1000 MCG tablet Take 1,000 mcg by mouth daily.    [name] Kitchen diltiazem (CARDIZEM SR) 120 MG 12 hr capsule Take 120 mg by mouth daily.    [name] Kitchen diltiazem (TIAZAC) 180 MG 24 hr capsule TAKE ONE CAPSULE BY MOUTH ONE TIME DAILY  60 capsule 1  . HYDROcodone-acetaminophen (NORCO) 5-325 MG tablet 1-2 tabs po q6 hours prn pain (Patient not taking: Reported on 01/29/2019) 30 tablet 0  . levothyroxine (LEVOTHROID) 75 MCG tablet Take 88 mcg by mouth daily.     [name] Kitchen losartan-hydrochlorothiazide (HYZAAR) 100-25 MG per tablet Take 1 tablet by mouth daily.      . metoprolol succinate (TOPROL-XL) 25 MG 24 hr tablet TAKE ONE TABLET BY MOUTH ONE TIME DAILY  60 tablet 0  . METOPROLOL TARTRATE PO Take 25 mg by mouth daily.     . Multiple Vitamin (MULTIVITAMIN) tablet Take 1 tablet by mouth daily.    . Omega-3 Fatty Acids (FISH OIL PO) Take 1 capsule by mouth daily.    . Vitamin D, Cholecalciferol, 1000 units TABS Take 1,000 Units by mouth daily.      No current facility-administered medications on file prior to visit.     Cardiovascular studies:  Echocardiogram 01/27/2019: Left ventricle cavity is normal in size. Normal global wall motion.  Calculated EF 67%. Left atrial cavity is moderately dilated. Aneurysmal interatrial septum without 2D or color Doppler evidence of interatrial shunt. Right atrial cavity is moderately dilated. Moderate (Grade II) aortic regurgitation. Moderate (Grade III) mitral regurgitation. Moderate tricuspid regurgitation. Estimated pulmonary artery systolic pressure 35 mmHg. Mild pulmonic regurgitation. No significant change compared to previous study on 08/02/2018.  Lexiscan myoview stress test 08/16/2018: 1. The resting electrocardiogram demonstrated normal sinus rhythm, PAC  and normal rest repolarization. The stress electrocardiogram was strongly positive for myocardial ischemia. Patient developed SVT at the rate of 137 bpm during Lexiscan infusion that persisted for 6-8 minutes into recovery.  Associated with this she had 2 mm horizontal ST segment depression in the inferior and lateral leads which is resolved immediately into conversion to sinus rhythm.  Valsalva maneuver was performed for termination.  Patient's symptoms included dyspnea and chest tightness.  Patient was hypotensive with blood pressure dropping from 122/70 mmHg to 100/80 mmHg. (see enclosed tracings) 2. Myocardial perfusion imaging is normal. Overall left ventricular systolic function was normal without regional wall motion abnormalities. The left ventricular ejection fraction was 69%.  This is an intermediate risk study, clinical correlation recommended. ST-T changes could be rate related vs demand ischemia.  Event monitor 07/16/2018- 08/04/2018:  Multiple episodes of SVT, one episode of 42 second with wide complex tachycardia-differential includes ventricular tachycardia versus SVT with aberrancy.  Frequent PACs and PVCs.  No atrial fibrillation, atrial flutter, high-grade AV block.   No symptoms reported.  EKG 07/16/2018: Sinus arrhythmia 55 bpm. Inferolateral T wave inversion. Cannot exclude ischemia.   Recent  labs: 04/28/2018: H/H 13.6/39.6.  MCV 98.  Platelets 159. Glucose 88.  BUN/creatinine 20/1.11.  EGFR 46/53.  Sodium 141, potassium 4.7.  Rest of the CMP normal  Review of Systems  Constitution: Negative for decreased appetite, malaise/fatigue, weight gain and weight loss.  HENT: Negative for congestion.   Eyes: Negative for visual disturbance.  Cardiovascular: Negative for chest pain, dyspnea on exertion, leg swelling, palpitations and syncope.  Respiratory: Negative for cough.   Endocrine: Negative for cold intolerance.  Hematologic/Lymphatic: Does not bruise/bleed easily.  Skin: Negative for itching and rash.  Musculoskeletal: Negative for myalgias.  Gastrointestinal: Negative for abdominal pain, nausea and vomiting.  Genitourinary: Negative for dysuria.  Neurological: Negative for dizziness and weakness.  Psychiatric/Behavioral: The patient is not nervous/anxious.   All other systems reviewed and are negative.        Vitals:   06/30/19 0923  BP: 105/61  Pulse: 70   (Measured by the patient using a home BP monitor)  Physical Exam: Not performed, as this is a telephone visit        Assessment & Recommendations:  85 year old Caucasian female with hypertension, hypothyroidism, remote history of colon and breast cancer status post partial colectomy, left mastectomy, chemotherapy, SVT.  Supraventricualr tachycardia: Well controlled on medical managent. Conitnue diltiazem 180 mg daily, and metoprolol succinate 25 mg daily. I have given her diltiazem 30 mg for as needed use. If symptoms worsen, she will then need referred to EP for consideration for ablation.  Exertional dyspnea: Improved. Echocardiogram in 01/2019 with unchanged, stable moderate valvular regurgitations. Repeat echo in 2021 February  Pulmonary hypertension: Moderate tricuspid regurgitation with mild pulmonary hypertension. This could be WHO group to secondary to diastolic dysfunction as well as SVT.  WHO group 3 due to asthma is also a possibility. Repeat echocardiogram in 02.2021.  F/u in 02/2020.   Nigel Mormon, MD [facility] Pager: [phone] Office: [phone] If no answer Cell [phone]

## 2019-07-21 ENCOUNTER — Other Ambulatory Visit: Payer: Self-pay | Admitting: Cardiology

## 2019-07-21 NOTE — Telephone Encounter (Signed)
Please fill

## 2019-08-19 DIAGNOSIS — I10 Essential (primary) hypertension: Secondary | ICD-10-CM | POA: Diagnosis not present

## 2019-08-19 DIAGNOSIS — E039 Hypothyroidism, unspecified: Secondary | ICD-10-CM | POA: Diagnosis not present

## 2019-08-24 DIAGNOSIS — J45909 Unspecified asthma, uncomplicated: Secondary | ICD-10-CM | POA: Diagnosis not present

## 2019-08-24 DIAGNOSIS — E7800 Pure hypercholesterolemia, unspecified: Secondary | ICD-10-CM | POA: Diagnosis not present

## 2019-08-24 DIAGNOSIS — Z0000 Encounter for general adult medical examination without abnormal findings: Secondary | ICD-10-CM | POA: Diagnosis not present

## 2019-09-13 DIAGNOSIS — R311 Benign essential microscopic hematuria: Secondary | ICD-10-CM | POA: Diagnosis not present

## 2019-09-30 DIAGNOSIS — N281 Cyst of kidney, acquired: Secondary | ICD-10-CM | POA: Diagnosis not present

## 2019-09-30 DIAGNOSIS — R311 Benign essential microscopic hematuria: Secondary | ICD-10-CM | POA: Diagnosis not present

## 2019-09-30 DIAGNOSIS — R3129 Other microscopic hematuria: Secondary | ICD-10-CM | POA: Diagnosis not present

## 2019-10-11 DIAGNOSIS — R311 Benign essential microscopic hematuria: Secondary | ICD-10-CM | POA: Diagnosis not present

## 2020-01-10 ENCOUNTER — Other Ambulatory Visit: Payer: Medicare Other

## 2020-01-23 ENCOUNTER — Ambulatory Visit: Payer: Medicare Other | Admitting: Cardiology

## 2020-04-09 ENCOUNTER — Other Ambulatory Visit: Payer: Self-pay | Admitting: Cardiology

## 2020-08-29 DIAGNOSIS — I10 Essential (primary) hypertension: Secondary | ICD-10-CM | POA: Diagnosis not present

## 2020-08-29 DIAGNOSIS — E039 Hypothyroidism, unspecified: Secondary | ICD-10-CM | POA: Diagnosis not present

## 2020-09-05 DIAGNOSIS — Z0000 Encounter for general adult medical examination without abnormal findings: Secondary | ICD-10-CM | POA: Diagnosis not present

## 2020-09-05 DIAGNOSIS — R636 Underweight: Secondary | ICD-10-CM | POA: Diagnosis not present

## 2020-11-14 DIAGNOSIS — R350 Frequency of micturition: Secondary | ICD-10-CM | POA: Diagnosis not present

## 2020-11-29 DIAGNOSIS — M545 Low back pain, unspecified: Secondary | ICD-10-CM | POA: Diagnosis not present

## 2021-01-02 ENCOUNTER — Other Ambulatory Visit: Payer: Self-pay | Admitting: Cardiology

## 2021-01-02 ENCOUNTER — Other Ambulatory Visit: Payer: Self-pay

## 2021-01-02 MED ORDER — METOPROLOL SUCCINATE ER 25 MG PO TB24: 25.0000 mg | ORAL_TABLET | Freq: Every day | ORAL | 0 refills | Status: DC

## 2021-01-02 MED ORDER — DILTIAZEM HCL ER BEADS 180 MG PO CP24: 180.0000 mg | ORAL_CAPSULE | Freq: Every day | ORAL | 0 refills | Status: DC

## 2021-01-08 ENCOUNTER — Other Ambulatory Visit: Payer: Self-pay

## 2021-01-08 ENCOUNTER — Ambulatory Visit: Payer: Medicare Other

## 2021-01-08 DIAGNOSIS — I351 Nonrheumatic aortic (valve) insufficiency: Secondary | ICD-10-CM | POA: Diagnosis not present

## 2021-01-30 ENCOUNTER — Encounter: Payer: Self-pay | Admitting: Cardiology

## 2021-01-30 ENCOUNTER — Ambulatory Visit: Payer: Medicare Other | Admitting: Cardiology

## 2021-01-30 ENCOUNTER — Other Ambulatory Visit: Payer: Self-pay

## 2021-01-30 VITALS — BP 137/63 | HR 62 | Temp 97.8°F | Resp 16 | Ht 64.0 in | Wt 101.6 lb

## 2021-01-30 DIAGNOSIS — I471 Supraventricular tachycardia: Secondary | ICD-10-CM

## 2021-01-30 DIAGNOSIS — I499 Cardiac arrhythmia, unspecified: Secondary | ICD-10-CM | POA: Diagnosis not present

## 2021-01-30 DIAGNOSIS — R0600 Dyspnea, unspecified: Secondary | ICD-10-CM | POA: Insufficient documentation

## 2021-01-30 DIAGNOSIS — I351 Nonrheumatic aortic (valve) insufficiency: Secondary | ICD-10-CM

## 2021-01-30 DIAGNOSIS — R0609 Other forms of dyspnea: Secondary | ICD-10-CM

## 2021-01-30 DIAGNOSIS — I071 Rheumatic tricuspid insufficiency: Secondary | ICD-10-CM | POA: Diagnosis not present

## 2021-01-30 MED ORDER — METOPROLOL SUCCINATE ER 25 MG PO TB24: 25.0000 mg | ORAL_TABLET | Freq: Every day | ORAL | 3 refills | Status: DC

## 2021-01-30 MED ORDER — DILTIAZEM HCL ER BEADS 180 MG PO CP24: 180.0000 mg | ORAL_CAPSULE | Freq: Every day | ORAL | 3 refills | Status: DC

## 2021-01-30 NOTE — Progress Notes (Signed)
Subjective:   [name], female    DOB: August 10, 1933, 87 y.o.   MRN: [id number]   Chief complaint:  Supraventricular tachycardia   HPI  87 year old Caucasian female with hypertension, hypothyroidism, remote history of colon and breast cancer status post partial colectomy, left mastectomy, chemotherapy, SVT.  Patient is here with her husband. She has not had any palpitations symptoms. However, her smart watch has showed undetermined rhythm. She denies chest pain, but reports exertional dyspnea while walking up a hill.     Current Outpatient Medications on File Prior to Visit  Medication Sig Dispense Refill  . ADVAIR DISKUS 100-50 MCG/DOSE AEPB USE 1 PUFF BY MOUTH EVERY12 HOURS. (Patient taking differently: Inhale 1 puff into the lungs every 12 (twelve) hours. ) 60 each 0  . albuterol (PROAIR HFA) 108 (90 BASE) MCG/ACT inhaler Inhale 2 puffs into the lungs every 6 (six) hours as needed for wheezing. Needs to schedule an appt for refills. (Patient taking differently: Inhale 2 puffs into the lungs every 6 (six) hours as needed for wheezing. ) 1 Inhaler 0  . cyanocobalamin 1000 MCG tablet Take 1,000 mcg by mouth daily.    [name] Kitchen diltiazem (TIAZAC) 180 MG 24 hr capsule Take 1 capsule (180 mg total) by mouth daily. 30 capsule 0  . levothyroxine (LEVOTHROID) 75 MCG tablet Take 88 mcg by mouth daily.     [name] Kitchen losartan-hydrochlorothiazide (HYZAAR) 100-25 MG per tablet Take 1 tablet by mouth daily.      . metoprolol succinate (TOPROL-XL) 25 MG 24 hr tablet Take 1 tablet (25 mg total) by mouth daily. 30 tablet 0  . Multiple Vitamin (MULTIVITAMIN) tablet Take 1 tablet by mouth daily.    . Vitamin D, Cholecalciferol, 1000 units TABS Take 1,000 Units by mouth daily.      No current facility-administered medications on file prior to visit.    Cardiovascular studies:  EKG 01/30/2021: Sinus rhythm 61 bpm Infeerolateral T wave inversion, consider ischemia  Echocardiogram 01/08/2021:  Left  ventricle cavity is normal in size and wall thickness. Normal global  wall motion. Normal LV systolic function with visual EF 50-55%. Normal  diastolic filling pattern.  Right atrial cavity is moderately dilated.  Right ventricle cavity is mildly dilated. Normal right ventricular  function.  Structurally normal trileaflet aortic valve. No evidence of aortic  stenosis. Moderate (Grade II) aortic regurgitation.  Moderate (Grade II) mitral regurgitation.  Moderate tricuspid regurgitation.  Mild pulmonic regurgitation.  Estimated pulmonary artery systolic pressure 33 mmHg.  No significant change compared to previous study on 01/27/2019.   Lexiscan myoview stress test 08/16/2018: 1. The resting electrocardiogram demonstrated normal sinus rhythm, PAC and normal rest repolarization. The stress electrocardiogram was strongly positive for myocardial ischemia. Patient developed SVT at the rate of 137 bpm during Lexiscan infusion that persisted for 6-8 minutes into recovery.  Associated with this she had 2 mm horizontal ST segment depression in the inferior and lateral leads which is resolved immediately into conversion to sinus rhythm.  Valsalva maneuver was performed for termination.  Patient's symptoms included dyspnea and chest tightness.  Patient was hypotensive with blood pressure dropping from 122/70 mmHg to 100/80 mmHg. (see enclosed tracings) 2. Myocardial perfusion imaging is normal. Overall left ventricular systolic function was normal without regional wall motion abnormalities. The left ventricular ejection fraction was 69%.  This is an intermediate risk study, clinical correlation recommended. ST-T changes could be rate related vs demand ischemia.  Event monitor 07/16/2018- 08/04/2018:  Multiple episodes of  SVT, one episode of 42 second with wide complex tachycardia-differential includes ventricular tachycardia versus SVT with aberrancy.  Frequent PACs and PVCs.  No atrial fibrillation, atrial  flutter, high-grade AV block.   No symptoms reported.  EKG 07/16/2018: Sinus arrhythmia 55 bpm. Inferolateral T wave inversion. Cannot exclude ischemia.   Recent labs: 08/29/2020: Glucose N/A, BUN/Cr 18/1.09. EGFR 46. HbA1C N/A% Chol 287, TG 93, HDL 145, LDL 127 TSH 7.0normal  04/28/2018: H/H 13.6/39.6.  MCV 98.  Platelets 159. Glucose 88.  BUN/creatinine 20/1.11.  EGFR 46/53.  Sodium 141, potassium 4.7.  Rest of the CMP normal  Review of Systems  Cardiovascular: Positive for dyspnea on exertion and irregular heartbeat. Negative for chest pain, leg swelling, palpitations and syncope.         Vitals:   01/30/21 1431  BP: 137/63  Pulse: 62  Resp: 16  Temp: 97.8 F (36.6 C)  SpO2: 98%    Physical Exam Vitals and nursing note reviewed.  Constitutional:      General: She is not in acute distress. Neck:     Vascular: No JVD.  Cardiovascular:     Rate and Rhythm: Normal rate and regular rhythm.     Heart sounds: Normal heart sounds. No murmur heard.   Pulmonary:     Effort: Pulmonary effort is normal.     Breath sounds: Normal breath sounds. No wheezing or rales.  Musculoskeletal:     Right lower leg: No edema.     Left lower leg: No edema.           Assessment & Recommendations:  87 year old Caucasian female with hypertension, hypothyroidism, remote history of colon and breast cancer status post partial colectomy, left mastectomy, chemotherapy, SVT, exertional dyspnea and irregular heart beat  Exertional dyspnea: Unchanged echocardiogram with stable mod MR,TR. Angina equivalent is a possibility. Will obtain lexiscan nuclear stress test. Given possible W  Irregular heart beat: Recommend cardiac telemetry to r/o Afib  Supraventricualr tachycardia: Well controlled on medical managent. Conitnue diltiazem 180 mg daily, and metoprolol succinate 25 mg daily. I have given her diltiazem 30 mg for as needed use. If symptoms worsen, she will then need referred to EP  for consideration for ablation.  F/u in 4 weeks   [place], MD [facility] Pager: [phone] Office: [phone] If no answer Cell [phone]

## 2021-02-04 ENCOUNTER — Other Ambulatory Visit: Payer: Self-pay

## 2021-02-04 ENCOUNTER — Ambulatory Visit: Payer: Medicare Other

## 2021-02-04 ENCOUNTER — Inpatient Hospital Stay: Payer: Medicare Other

## 2021-02-04 DIAGNOSIS — I471 Supraventricular tachycardia: Secondary | ICD-10-CM

## 2021-02-04 DIAGNOSIS — R0609 Other forms of dyspnea: Secondary | ICD-10-CM | POA: Diagnosis not present

## 2021-02-04 DIAGNOSIS — R0600 Dyspnea, unspecified: Secondary | ICD-10-CM

## 2021-02-05 NOTE — Progress Notes (Signed)
Called and spoke with pt regarding stress test results. Pt voiced understanding.

## 2021-02-22 DIAGNOSIS — I471 Supraventricular tachycardia: Secondary | ICD-10-CM | POA: Diagnosis not present

## 2021-02-25 ENCOUNTER — Other Ambulatory Visit: Payer: Self-pay | Admitting: Cardiology

## 2021-02-25 DIAGNOSIS — I471 Supraventricular tachycardia: Secondary | ICD-10-CM

## 2021-03-07 ENCOUNTER — Encounter: Payer: Self-pay | Admitting: Cardiology

## 2021-03-07 ENCOUNTER — Other Ambulatory Visit: Payer: Self-pay

## 2021-03-07 ENCOUNTER — Ambulatory Visit: Payer: Medicare Other | Admitting: Cardiology

## 2021-03-07 VITALS — BP 127/44 | HR 69 | Resp 16 | Ht 64.0 in | Wt 102.2 lb

## 2021-03-07 DIAGNOSIS — I471 Supraventricular tachycardia: Secondary | ICD-10-CM

## 2021-03-07 DIAGNOSIS — I351 Nonrheumatic aortic (valve) insufficiency: Secondary | ICD-10-CM

## 2021-03-07 DIAGNOSIS — I071 Rheumatic tricuspid insufficiency: Secondary | ICD-10-CM | POA: Diagnosis not present

## 2021-03-07 NOTE — Progress Notes (Signed)
Subjective:   [name], female    DOB: September 13, 1933, 87 y.o.   MRN: [id number]   Chief complaint:  Supraventricular tachycardia   HPI  87 year old Caucasian female with hypertension, hypothyroidism, remote history of colon and breast cancer status post partial colectomy, left mastectomy, chemotherapy, SVT.  Since last visit, patient noted 2 episodes of palpitations.  Reviewed recent cardiac telemetry and stress test results with the patient, details below.     Current Outpatient Medications on File Prior to Visit  Medication Sig Dispense Refill  . ADVAIR DISKUS 100-50 MCG/DOSE AEPB USE 1 PUFF BY MOUTH EVERY12 HOURS. (Patient taking differently: Inhale 1 puff into the lungs every 12 (twelve) hours.) 60 each 0  . albuterol (PROAIR HFA) 108 (90 BASE) MCG/ACT inhaler Inhale 2 puffs into the lungs every 6 (six) hours as needed for wheezing. Needs to schedule an appt for refills. (Patient taking differently: Inhale 2 puffs into the lungs every 6 (six) hours as needed for wheezing.) 1 Inhaler 0  . cyanocobalamin 1000 MCG tablet Take 1,000 mcg by mouth daily.    [name] Kitchen diltiazem (TIAZAC) 180 MG 24 hr capsule Take 1 capsule (180 mg total) by mouth daily. 90 capsule 3  . fluticasone (FLONASE) 50 MCG/ACT nasal spray as needed.    [name] Kitchen levothyroxine (SYNTHROID) 75 MCG tablet Take 88 mcg by mouth daily.    [name] Kitchen losartan-hydrochlorothiazide (HYZAAR) 100-25 MG per tablet Take 1 tablet by mouth daily.    . metoprolol succinate (TOPROL-XL) 25 MG 24 hr tablet TAKE ONE TABLET BY MOUTH ONE TIME DAILY **PT NEEDS APPOINTMENT** 30 tablet 0  . Multiple Vitamin (MULTIVITAMIN) tablet Take 1 tablet by mouth daily.    . Vitamin D, Cholecalciferol, 1000 units TABS Take 1,000 Units by mouth daily.      No current facility-administered medications on file prior to visit.    Cardiovascular studies:  Mobile cardiac telemetry 13 days 02/04/2021 - 02/18/2021: Dominant rhythm: Sinus. HR 42-91 bpm. Avg HR 62 bpm. 95  episodes of SVT-probable atrial tachycardia, fastest at 174 bpm for 6 min 13 sec, longest for 16 min 29 sec at 151 bpm. 6.1%% isolated SVE, <1 couplet/triplets. <1% isolated VE, no couplet/triplets. No atrial fibrillation/atrial flutter/VT/high grade AV block, sinus pause >3sec noted. 2 patient triggered events, correlated with SVE, SVT    Lexiscan Tetrofosmin Stress Test  02/04/2021: Nondiagnostic ECG stress. Myocardial perfusion is normal. Overall LV systolic function is normal without regional wall motion abnormalities. Stress LV EF: 74%.  No previous exam available for comparison. Low risk.    EKG 01/30/2021: Sinus rhythm 61 bpm Infeerolateral T wave inversion, consider ischemia  Echocardiogram 01/08/2021:  Left ventricle cavity is normal in size and wall thickness. Normal global  wall motion. Normal LV systolic function with visual EF 50-55%. Normal  diastolic filling pattern.  Right atrial cavity is moderately dilated.  Right ventricle cavity is mildly dilated. Normal right ventricular  function.  Structurally normal trileaflet aortic valve. No evidence of aortic  stenosis. Moderate (Grade II) aortic regurgitation.  Moderate (Grade II) mitral regurgitation.  Moderate tricuspid regurgitation.  Mild pulmonic regurgitation.  Estimated pulmonary artery systolic pressure 33 mmHg.  No significant change compared to previous study on 01/27/2019.   Lexiscan myoview stress test 08/16/2018: 1. The resting electrocardiogram demonstrated normal sinus rhythm, PAC and normal rest repolarization. The stress electrocardiogram was strongly positive for myocardial ischemia. Patient developed SVT at the rate of 137 bpm during Lexiscan infusion that persisted for 6-8 minutes into recovery.  Associated with this she had 2 mm horizontal ST segment depression in the inferior and lateral leads which is resolved immediately into conversion to sinus rhythm.  Valsalva maneuver was performed for  termination.  Patient's symptoms included dyspnea and chest tightness.  Patient was hypotensive with blood pressure dropping from 122/70 mmHg to 100/80 mmHg. (see enclosed tracings) 2. Myocardial perfusion imaging is normal. Overall left ventricular systolic function was normal without regional wall motion abnormalities. The left ventricular ejection fraction was 69%.  This is an intermediate risk study, clinical correlation recommended. ST-T changes could be rate related vs demand ischemia.  Event monitor 07/16/2018- 08/04/2018:  Multiple episodes of SVT, one episode of 42 second with wide complex tachycardia-differential includes ventricular tachycardia versus SVT with aberrancy.  Frequent PACs and PVCs.  No atrial fibrillation, atrial flutter, high-grade AV block.   No symptoms reported.  EKG 07/16/2018: Sinus arrhythmia 55 bpm. Inferolateral T wave inversion. Cannot exclude ischemia.   Recent labs: 08/29/2020: Glucose N/A, BUN/Cr 18/1.09. EGFR 46. HbA1C N/A% Chol 287, TG 93, HDL 145, LDL 127 TSH 7.0normal  04/28/2018: H/H 13.6/39.6.  MCV 98.  Platelets 159. Glucose 88.  BUN/creatinine 20/1.11.  EGFR 46/53.  Sodium 141, potassium 4.7.  Rest of the CMP normal  Review of Systems  Cardiovascular: Positive for dyspnea on exertion (Occasional) and palpitations (Occasional). Negative for chest pain, leg swelling and syncope.         Vitals:   03/07/21 1255  BP: (!) 127/44  Pulse: 69  Resp: 16  SpO2: 95%    Physical Exam Vitals and nursing note reviewed.  Constitutional:      General: She is not in acute distress. Neck:     Vascular: No JVD.  Cardiovascular:     Rate and Rhythm: Normal rate and regular rhythm.     Heart sounds: Normal heart sounds. No murmur heard.   Pulmonary:     Effort: Pulmonary effort is normal.     Breath sounds: Normal breath sounds. No wheezing or rales.  Musculoskeletal:     Right lower leg: No edema.     Left lower leg: No edema.            Assessment & Recommendations:  87 year old Caucasian female with hypertension, hypothyroidism, remote history of colon and breast cancer status post partial colectomy, left mastectomy, chemotherapy, SVT, exertional dyspnea and irregular heart beat  Exertional dyspnea: Intermittent.  No ischemia/infarction on stress test Unchanged echocardiogram with stable mod MR,TR. Angina equivalent is a possibility.  I suspect his symptoms may be related to multiple episodes of tachycardia, likely atrial tachycardia.  Supraventricualr tachycardia: Likely to tachycardia.  Continue metoprolol and diltiazem.  Continue the same. Patient does not feel most of these episodes and therefore, does not want to make any changes to her medical therapy at this time.  Should her symptoms get worse, or should she have any presyncope or syncope episodes, I recommend strong consideration for antiarrhythmic therapy or ablation.  Medication of choice for antiarrhythmic therapy would be flecainide, after obtaining CT cardiac scoring scan to rule put subclinical CAD.  F/u in 3 months   [place], MD [facility] [place] Pager: [phone] Office: [phone] If no answer Cell [phone]

## 2021-04-01 ENCOUNTER — Other Ambulatory Visit: Payer: Self-pay | Admitting: Cardiology

## 2021-04-01 DIAGNOSIS — I471 Supraventricular tachycardia: Secondary | ICD-10-CM

## 2021-06-02 NOTE — Progress Notes (Signed)
Subjective:   [name], female    DOB: [birth year], 87 y.o.   MRN: [id number]   Chief complaint:  Supraventricular tachycardia   HPI  87 year old Caucasian female with hypertension, hypothyroidism, remote history of colon and breast cancer status post partial colectomy, left mastectomy, chemotherapy, SVT.  Patient denies chest pain, shortness of breath, palpitations, leg edema, orthopnea, PND, TIA/syncope. She has lost weight lately, but denies loss of appetite or any other constitutional symptoms.    Current Outpatient Medications on File Prior to Visit  Medication Sig Dispense Refill   ADVAIR DISKUS 100-50 MCG/DOSE AEPB USE 1 PUFF BY MOUTH EVERY12 HOURS. (Patient taking differently: Inhale 1 puff into the lungs every 12 (twelve) hours.) 60 each 0   albuterol (PROAIR HFA) 108 (90 BASE) MCG/ACT inhaler Inhale 2 puffs into the lungs every 6 (six) hours as needed for wheezing. Needs to schedule an appt for refills. (Patient taking differently: Inhale 2 puffs into the lungs every 6 (six) hours as needed for wheezing.) 1 Inhaler 0   cyanocobalamin 1000 MCG tablet Take 1,000 mcg by mouth daily.     diltiazem (TIAZAC) 180 MG 24 hr capsule Take 1 capsule (180 mg total) by mouth daily. 90 capsule 3   fluticasone (FLONASE) 50 MCG/ACT nasal spray as needed.     levothyroxine (SYNTHROID) 75 MCG tablet Take 88 mcg by mouth daily.     losartan-hydrochlorothiazide (HYZAAR) 100-25 MG per tablet Take 1 tablet by mouth daily.     metoprolol succinate (TOPROL-XL) 25 MG 24 hr tablet TAKE ONE TABLET BY MOUTH ONE TIME DAILY 30 tablet 3   Vitamin D, Cholecalciferol, 1000 units TABS Take 1,000 Units by mouth daily.      No current facility-administered medications on file prior to visit.    Cardiovascular studies:  Mobile cardiac telemetry 13 days 02/04/2021 - 02/18/2021: Dominant rhythm: Sinus. HR 42-91 bpm. Avg HR 62 bpm. 95 episodes of SVT-probable atrial tachycardia, fastest at 174 bpm  for 6 min 13 sec, longest for 16 min 29 sec at 151 bpm. 6.1%% isolated SVE, <1 couplet/triplets. <1% isolated VE, no couplet/triplets. No atrial fibrillation/atrial flutter/VT/high grade AV block, sinus pause >3sec noted. 2 patient triggered events, correlated with SVE, SVT    Lexiscan Tetrofosmin Stress Test  02/04/2021: Nondiagnostic ECG stress. Myocardial perfusion is normal. Overall LV systolic function is normal without regional wall motion abnormalities. Stress LV EF: 74%.  No previous exam available for comparison. Low risk.    EKG 01/30/2021: Sinus rhythm 61 bpm Infeerolateral T wave inversion, consider ischemia  Echocardiogram 01/08/2021:  Left ventricle cavity is normal in size and wall thickness. Normal global  wall motion. Normal LV systolic function with visual EF 50-55%. Normal  diastolic filling pattern.  Right atrial cavity is moderately dilated.  Right ventricle cavity is mildly dilated. Normal right ventricular  function.  Structurally normal trileaflet aortic valve. No evidence of aortic  stenosis. Moderate (Grade II) aortic regurgitation.  Moderate (Grade II) mitral regurgitation.  Moderate tricuspid regurgitation.  Mild pulmonic regurgitation.  Estimated pulmonary artery systolic pressure 33 mmHg.  No significant change compared to previous study on 01/27/2019.   Lexiscan myoview stress test 08/16/2018: 1. The resting electrocardiogram demonstrated normal sinus rhythm, PAC and normal rest repolarization. The stress electrocardiogram was strongly positive for myocardial ischemia. Patient developed SVT at the rate of 137 bpm during Lexiscan infusion that persisted for 6-8 minutes into recovery.  Associated with this she had 2 mm horizontal ST segment depression in  the inferior and lateral leads which is resolved immediately into conversion to sinus rhythm.  Valsalva maneuver was performed for termination.  Patient's symptoms included dyspnea and chest tightness.   Patient was hypotensive with blood pressure dropping from 122/70 mmHg to 100/80 mmHg. (see enclosed tracings) 2. Myocardial perfusion imaging is normal. Overall left ventricular systolic function was normal without regional wall motion abnormalities. The left ventricular ejection fraction was 69%.  This is an intermediate risk study, clinical correlation recommended. ST-T changes could be rate related vs demand ischemia.  Event monitor 07/16/2018- 08/04/2018:  Multiple episodes of SVT, one episode of 42 second with wide complex tachycardia-differential includes ventricular tachycardia versus SVT with aberrancy.  Frequent PACs and PVCs.  No atrial fibrillation, atrial flutter, high-grade AV block.   No symptoms reported.  EKG 07/16/2018: Sinus arrhythmia 55 bpm. Inferolateral T wave inversion. Cannot exclude ischemia.   Recent labs: 08/29/2020: Glucose N/A, BUN/Cr 18/1.09. EGFR 46. HbA1C N/A% Chol 287, TG 93, HDL 145, LDL 127 TSH 7.0normal  04/28/2018: H/H 13.6/39.6.  MCV 98.  Platelets 159. Glucose 88.  BUN/creatinine 20/1.11.  EGFR 46/53.  Sodium 141, potassium 4.7.  Rest of the CMP normal  Review of Systems  Cardiovascular:  Positive for dyspnea on exertion (Occasional) and palpitations (Occasional).        There were no vitals filed for this visit.   Physical Exam Vitals and nursing note reviewed.  Constitutional:      General: She is not in acute distress. Neck:     Vascular: No JVD.  Cardiovascular:     Rate and Rhythm: Normal rate and regular rhythm.     Heart sounds: Murmur heard.  High-pitched blowing holosystolic murmur is present at the apex.  High-pitched blowing decrescendo early diastolic murmur is present with a grade of 2/4 at the upper right sternal border radiating to the apex.  Pulmonary:     Effort: Pulmonary effort is normal.     Breath sounds: Normal breath sounds. No wheezing or rales.          Assessment & Recommendations:  87 year old  Caucasian female with hypertension, hypothyroidism, remote history of colon and breast cancer status post partial colectomy, left mastectomy, chemotherapy, SVT, exertional dyspnea and irregular heart beat  Exertional dyspnea: Intermittent.  No ischemia/infarction on stress test Unchanged echocardiogram with stable mod MR,TR. Angina equivalent is a possibility.  I suspect his symptoms may be related to multiple episodes of tachycardia, likely atrial tachycardia. Now improved.  Supraventricular tachycardia: Likely atrial tachycardia Well controlled on metoprolol and diltiazem.  Continue the same.  Continue f/u w/PCP re: weight loss  F/u in 6 months   [place], MD [facility] Pager: [phone] Office: [phone] If no answer Cell [phone]

## 2021-06-03 ENCOUNTER — Ambulatory Visit: Payer: Medicare Other | Admitting: Cardiology

## 2021-06-03 ENCOUNTER — Other Ambulatory Visit: Payer: Self-pay

## 2021-06-03 ENCOUNTER — Encounter: Payer: Self-pay | Admitting: Cardiology

## 2021-06-03 VITALS — BP 120/63 | HR 59 | Temp 98.0°F | Resp 16 | Ht 64.0 in | Wt 99.0 lb

## 2021-06-03 DIAGNOSIS — I351 Nonrheumatic aortic (valve) insufficiency: Secondary | ICD-10-CM

## 2021-06-03 DIAGNOSIS — I071 Rheumatic tricuspid insufficiency: Secondary | ICD-10-CM | POA: Diagnosis not present

## 2021-06-03 DIAGNOSIS — I471 Supraventricular tachycardia: Secondary | ICD-10-CM

## 2021-06-05 DIAGNOSIS — R5383 Other fatigue: Secondary | ICD-10-CM | POA: Diagnosis not present

## 2021-06-05 DIAGNOSIS — R109 Unspecified abdominal pain: Secondary | ICD-10-CM | POA: Diagnosis not present

## 2021-06-05 DIAGNOSIS — E039 Hypothyroidism, unspecified: Secondary | ICD-10-CM | POA: Diagnosis not present

## 2021-06-05 DIAGNOSIS — K5900 Constipation, unspecified: Secondary | ICD-10-CM | POA: Diagnosis not present

## 2021-06-05 DIAGNOSIS — Z85038 Personal history of other malignant neoplasm of large intestine: Secondary | ICD-10-CM | POA: Diagnosis not present

## 2021-06-05 DIAGNOSIS — R1084 Generalized abdominal pain: Secondary | ICD-10-CM | POA: Diagnosis not present

## 2021-06-05 DIAGNOSIS — R634 Abnormal weight loss: Secondary | ICD-10-CM | POA: Diagnosis not present

## 2021-06-05 DIAGNOSIS — I10 Essential (primary) hypertension: Secondary | ICD-10-CM | POA: Diagnosis not present

## 2021-06-12 ENCOUNTER — Encounter: Payer: Self-pay | Admitting: Nurse Practitioner

## 2021-06-19 DIAGNOSIS — E039 Hypothyroidism, unspecified: Secondary | ICD-10-CM | POA: Diagnosis not present

## 2021-06-19 DIAGNOSIS — Z85038 Personal history of other malignant neoplasm of large intestine: Secondary | ICD-10-CM | POA: Diagnosis not present

## 2021-06-19 DIAGNOSIS — R1084 Generalized abdominal pain: Secondary | ICD-10-CM | POA: Diagnosis not present

## 2021-06-19 DIAGNOSIS — R636 Underweight: Secondary | ICD-10-CM | POA: Diagnosis not present

## 2021-07-11 ENCOUNTER — Ambulatory Visit (INDEPENDENT_AMBULATORY_CARE_PROVIDER_SITE_OTHER): Payer: Medicare Other | Admitting: Nurse Practitioner

## 2021-07-11 ENCOUNTER — Encounter: Payer: Self-pay | Admitting: Nurse Practitioner

## 2021-07-11 VITALS — BP 116/58 | HR 60 | Ht 61.25 in | Wt 101.8 lb

## 2021-07-11 DIAGNOSIS — Z85038 Personal history of other malignant neoplasm of large intestine: Secondary | ICD-10-CM | POA: Diagnosis not present

## 2021-07-11 NOTE — Progress Notes (Signed)
ASSESSMENT AND PLAN    #87 year old female with a remote history of colon cancer status post right hemicolectomy in 2004.  No recurrent cancer nor polyps on last surveillance colonoscopy in 2013 (Dr. Olevia Perches).  Dr. Havery Moros reviewed patient's record in 2018 and sent her a letter advising office appointment to discuss surveillance colonoscopy.  Unfortunately patient did not receive the letter and was unaware of the need for another colonoscopy.  --We discussed the risk/benefits of proceeding with a colonoscopy.  Patient is relatively healthy but still at increased risk given her age.  She has no alarm symptoms, recent labs were unremarkable.  She is going to think about whether she wants to proceed with a colonoscopy if Dr. Havery Moros still feels it is appropriate at this point. Will contact patient after discussion with Dr. Havery Moros.   # Recent constipation, resolved after eating fruit.  Also her TSH was mildly elevated and Synthroid dose has been increased.  # Weight loss.  Patient and husband report only about a 4 pound weight loss over the last 1+ years.    HISTORY OF PRESENT ILLNESS     Chief Complaint : none at present but had recent constipation.   [name] is a 87 y.o. female with a past medical history significant for colon cancer in 2004, breast cancer, hypothyroidism, supraventricular tachycardia, hypertension see PMH below for any additional history.    Patient referred by PCP for evaluation of constipation, weight loss and history of colon cancer.  Patient previously followed by Dr. Olevia Perches, not seen since time of last colonoscopy in 2013.  Unable to view office records from 2010.  However, based on colonoscopy report in 2013 patient has a history of colon cancer and underwent a right hemicolectomy in 2004.    Patient is accompanied by her husband.  She does describe a 2-week history of constipation.  After eating fruit the constipation resolved last week. . She has  lost about 4 pounds but that has been over 1+ years.    Other than mild fatigue patient feels okay.  Again, constipation has resolved.  Her Synthroid dose was recently increased and patient and her husband feel like this has resulted in a little less fatigue.  Patient has not had any blood in her stool.    Data Reviewed:  I do not know the exact date of these labs that they were inserted into PCPs office note from 06/05/2021 and are as follows:  TSH was mildly elevated at 5.09.  Her B12 was greater than 2000, hemoglobin 13.4, MCV 98.2, renal function normal, LFTs normal    PREVIOUS GI EVALUATIONS:    August 2013 surveillance colonoscopy. -- Extent of exam was to the ileocolonic anastomosis.  Bowel prep was excellent.  Mild sigmoid diverticulosis.  Very difficult turn at 20 cm, lumen angulated so had to use a pediatric scope.  2 mm rectal polyp was removed.  5-year surveillance colonoscopy recommended.  Polyp was hyperplastic   Past Medical History:  Diagnosis Date   Allergy    Arthritis    Asthma    Blood transfusion 2004   Breast cancer ([place])    1984.  right mastectomy   Colon cancer ([place])    2004   COPD (chronic obstructive pulmonary disease) ([place])    Dysrhythmia    racing heartbeats at times, takes metoprolol   Hypertension    Hypothyroidism    Osteopenia      Past Surgical History:  Procedure Laterality Date  COLON RESECTION  2004   COLON SURGERY     rt hemicolectomy for Ca   HYSTERECTOMY ABDOMINAL WITH SALPINGO-OOPHORECTOMY     MASTECTOMY Right    rt   NASAL POLYP EXCISION     OPEN REDUCTION INTERNAL FIXATION (ORIF) DISTAL PHALANX Right 08/21/2016   Procedure: OPEN REDUCTION INTERNAL FIXATION (ORIF) RIGHT RADIUS DISTAL PHALANX;  Surgeon: Leanora Cover, MD;  Location: [facility];  Service: Orthopedics;  Laterality: Right;   VESICOVAGINAL FISTULA CLOSURE W/ TAH     Family History  Problem Relation Age of Onset   Allergies Mother    Asthma Mother     Kidney cancer Mother        mets   Heart attack Mother    Liver cancer Mother    Heart attack Father    Colon cancer Neg Hx    Stomach cancer Neg Hx    Social History   Tobacco Use   Smoking status: Former    Years: 1.00    Types: Cigarettes    Quit date: 12/08/1982    Years since quitting: 38.6   Smokeless tobacco: Never   Tobacco comments:    smoked "on and off socially"  Vaping Use   Vaping Use: Never used  Substance Use Topics   Alcohol use: Yes    Alcohol/week: 7.0 standard drinks    Types: 7 Standard drinks or equivalent per week    Comment: wine 1 per day   Drug use: No   Current Outpatient Medications  Medication Sig Dispense Refill   ADVAIR DISKUS 100-50 MCG/DOSE AEPB USE 1 PUFF BY MOUTH EVERY12 HOURS. (Patient taking differently: Inhale 1 puff into the lungs every 12 (twelve) hours.) 60 each 0   albuterol (PROAIR HFA) 108 (90 BASE) MCG/ACT inhaler Inhale 2 puffs into the lungs every 6 (six) hours as needed for wheezing. Needs to schedule an appt for refills. (Patient taking differently: Inhale 2 puffs into the lungs every 6 (six) hours as needed for wheezing.) 1 Inhaler 0   cyanocobalamin 1000 MCG tablet Take 1,000 mcg by mouth daily.     diltiazem (TIAZAC) 180 MG 24 hr capsule Take 1 capsule (180 mg total) by mouth daily. 90 capsule 3   fluticasone (FLONASE) 50 MCG/ACT nasal spray as needed.     levothyroxine (SYNTHROID) 112 MCG tablet Take 112 mcg by mouth daily.     losartan-hydrochlorothiazide (HYZAAR) 100-25 MG per tablet Take 1 tablet by mouth daily.     metoprolol succinate (TOPROL-XL) 25 MG 24 hr tablet TAKE ONE TABLET BY MOUTH ONE TIME DAILY 30 tablet 3   Vitamin D, Cholecalciferol, 1000 units TABS Take 1,000 Units by mouth daily.      No current facility-administered medications for this visit.   No Active Allergies   Review of Systems:  All other systems reviewed and negative except where noted in HPI.    PHYSICAL EXAM :    Wt Readings from Last 3  Encounters:  07/11/21 101 lb 12.8 oz (46.2 kg)  06/03/21 99 lb (44.9 kg)  03/07/21 102 lb 3.2 oz (46.4 kg)    BP (!) 116/58 (BP Location: Left Arm, Patient Position: Sitting, Cuff Size: Normal)   Pulse 60   Ht 5' 1.25" (1.556 m) Comment: height measured without shoes  Wt 101 lb 12.8 oz (46.2 kg)   BMI 19.08 kg/m  Constitutional:  Pleasant thin female in no acute distress. Psychiatric: Normal mood and affect. Behavior is normal. EENT: Pupils normal.  Conjunctivae  are normal. No scleral icterus. Neck supple.  Cardiovascular: Normal rate, regular rhythm. No edema Pulmonary/chest: Effort normal and breath sounds normal. No wheezing, rales or rhonchi. Abdominal: Soft, nondistended, nontender. Bowel sounds active throughout. There are no masses palpable. No hepatomegaly. Neurological: Alert and oriented to person place and time. Skin: Skin is warm and dry. No rashes noted.  Tye Savoy, NP  07/11/2021, 4:15 PM  Cc:  Referring Provider Deland Pretty, MD

## 2021-07-11 NOTE — Patient Instructions (Signed)
We will be in touch with you after speaking with Dr Havery Moros about scheduling a colonoscopy   If you are age 85 or older, your body mass index should be between 23-30. Your Body mass index is 19.08 kg/m. If this is out of the aforementioned range listed, please consider follow up with your Primary Care Provider.  If you are age 87 or younger, your body mass index should be between 19-25. Your Body mass index is 19.08 kg/m. If this is out of the aformentioned range listed, please consider follow up with your Primary Care Provider.   __________________________________________________________  The [facility] providers would like to encourage you to use [facility] to communicate with providers for non-urgent requests or questions.  Due to long hold times on the telephone, sending your provider a message by [facility] may be a faster and more efficient way to get a response.  Please allow 48 business hours for a response.  Please remember that this is for non-urgent requests.    I appreciate the  opportunity to care for you  Thank You   [place]

## 2021-07-12 NOTE — Progress Notes (Signed)
Agree with assessment and plan as outlined. Given her age, would not normally recommend a routine surveillance colonoscopy even with her prior history, but ultimately up to her and how aggressive she wishes to be at this point in her life, weighing risks / benefits of doing the exam and not doing it. IF she had any alarm symptoms or anemia I would consider it however if she is otherwise feeling well and normal labs, don't feel strongly she needs another exam given her age as outlined, again unless she strongly feels otherwise.

## 2021-07-14 ENCOUNTER — Other Ambulatory Visit: Payer: Self-pay | Admitting: Cardiology

## 2021-07-14 DIAGNOSIS — I471 Supraventricular tachycardia: Secondary | ICD-10-CM

## 2021-08-10 ENCOUNTER — Other Ambulatory Visit: Payer: Self-pay | Admitting: Cardiology

## 2021-08-10 DIAGNOSIS — I471 Supraventricular tachycardia: Secondary | ICD-10-CM

## 2021-08-13 ENCOUNTER — Other Ambulatory Visit: Payer: Self-pay | Admitting: Cardiology

## 2021-08-13 DIAGNOSIS — I471 Supraventricular tachycardia: Secondary | ICD-10-CM

## 2021-08-15 ENCOUNTER — Other Ambulatory Visit: Payer: Self-pay | Admitting: Cardiology

## 2021-08-15 DIAGNOSIS — I471 Supraventricular tachycardia: Secondary | ICD-10-CM

## 2021-08-19 DIAGNOSIS — E039 Hypothyroidism, unspecified: Secondary | ICD-10-CM | POA: Diagnosis not present

## 2021-08-22 DIAGNOSIS — Z85038 Personal history of other malignant neoplasm of large intestine: Secondary | ICD-10-CM | POA: Diagnosis not present

## 2021-08-22 DIAGNOSIS — R636 Underweight: Secondary | ICD-10-CM | POA: Diagnosis not present

## 2021-08-22 DIAGNOSIS — E039 Hypothyroidism, unspecified: Secondary | ICD-10-CM | POA: Diagnosis not present

## 2021-09-09 ENCOUNTER — Other Ambulatory Visit: Payer: Self-pay | Admitting: Cardiology

## 2021-09-09 DIAGNOSIS — I471 Supraventricular tachycardia: Secondary | ICD-10-CM

## 2021-10-09 ENCOUNTER — Other Ambulatory Visit: Payer: Self-pay | Admitting: Cardiology

## 2021-10-09 DIAGNOSIS — I471 Supraventricular tachycardia: Secondary | ICD-10-CM

## 2021-11-05 ENCOUNTER — Other Ambulatory Visit: Payer: Self-pay | Admitting: Cardiology

## 2021-11-05 DIAGNOSIS — N644 Mastodynia: Secondary | ICD-10-CM | POA: Diagnosis not present

## 2021-11-05 DIAGNOSIS — I471 Supraventricular tachycardia: Secondary | ICD-10-CM

## 2021-11-08 ENCOUNTER — Other Ambulatory Visit: Payer: Self-pay | Admitting: Registered Nurse

## 2021-11-08 DIAGNOSIS — N644 Mastodynia: Secondary | ICD-10-CM

## 2021-11-21 ENCOUNTER — Ambulatory Visit
Admission: RE | Admit: 2021-11-21 | Discharge: 2021-11-21 | Disposition: A | Discharge status: Home / Self Care | Payer: Medicare Other | Source: Ambulatory Visit | Attending: Registered Nurse | Admitting: Registered Nurse

## 2021-11-21 ENCOUNTER — Ambulatory Visit: Payer: Medicare Other

## 2021-11-21 DIAGNOSIS — R922 Inconclusive mammogram: Secondary | ICD-10-CM | POA: Diagnosis not present

## 2021-11-21 DIAGNOSIS — N644 Mastodynia: Secondary | ICD-10-CM

## 2021-12-03 ENCOUNTER — Other Ambulatory Visit: Payer: Self-pay | Admitting: Cardiology

## 2021-12-03 DIAGNOSIS — I471 Supraventricular tachycardia: Secondary | ICD-10-CM

## 2021-12-05 ENCOUNTER — Ambulatory Visit: Payer: Medicare Other | Admitting: Cardiology

## 2021-12-16 ENCOUNTER — Ambulatory Visit: Payer: Medicare Other | Admitting: Cardiology

## 2021-12-26 NOTE — Progress Notes (Signed)
Subjective:   [name], female    DOB: Mar 16, 1933, 87 y.o.   MRN: [id number]   Chief complaint:  Supraventricular tachycardia   HPI  88 year old Caucasian female with hypertension, hypothyroidism, remote history of colon and breast cancer status post partial colectomy, left mastectomy, chemotherapy, SVT.  Patient is doing well, denies chest pain, shortness of breath, palpitations, leg edema, orthopnea, PND, TIA/syncope. Husband checks her rhythm on his Apple watch from time to time.    Current Outpatient Medications on File Prior to Visit  Medication Sig Dispense Refill   ADVAIR DISKUS 100-50 MCG/DOSE AEPB USE 1 PUFF BY MOUTH EVERY12 HOURS. (Patient taking differently: Inhale 1 puff into the lungs every 12 (twelve) hours.) 60 each 0   albuterol (PROAIR HFA) 108 (90 BASE) MCG/ACT inhaler Inhale 2 puffs into the lungs every 6 (six) hours as needed for wheezing. Needs to schedule an appt for refills. (Patient taking differently: Inhale 2 puffs into the lungs every 6 (six) hours as needed for wheezing.) 1 Inhaler 0   cyanocobalamin 1000 MCG tablet Take 1,000 mcg by mouth daily.     diltiazem (TIAZAC) 180 MG 24 hr capsule Take 1 capsule (180 mg total) by mouth daily. 90 capsule 3   fluticasone (FLONASE) 50 MCG/ACT nasal spray as needed.     levothyroxine (SYNTHROID) 112 MCG tablet Take 112 mcg by mouth daily.     losartan-hydrochlorothiazide (HYZAAR) 100-25 MG per tablet Take 1 tablet by mouth daily.     metoprolol succinate (TOPROL-XL) 25 MG 24 hr tablet TAKE ONE TABLET BY MOUTH ONE TIME DAILY 30 tablet 0   Vitamin D, Cholecalciferol, 1000 units TABS Take 1,000 Units by mouth daily.      No current facility-administered medications on file prior to visit.    Cardiovascular studies:  EKG 12/27/2021: Sinus arrhtymia 57 bpm Old anteroseptal infarct Nonspecific ST depression  Mobile cardiac telemetry 13 days 02/04/2021 - 02/18/2021: Dominant rhythm: Sinus. HR 42-91 bpm. Avg  HR 62 bpm. 95 episodes of SVT-probable atrial tachycardia, fastest at 174 bpm for 6 min 13 sec, longest for 16 min 29 sec at 151 bpm. 6.1%% isolated SVE, <1 couplet/triplets. <1% isolated VE, no couplet/triplets. No atrial fibrillation/atrial flutter/VT/high grade AV block, sinus pause >3sec noted. 2 patient triggered events, correlated with SVE, SVT    Lexiscan Tetrofosmin Stress Test  02/04/2021: Nondiagnostic ECG stress. Myocardial perfusion is normal. Overall LV systolic function is normal without regional wall motion abnormalities. Stress LV EF: 74%.  No previous exam available for comparison. Low risk.    EKG 01/30/2021: Sinus rhythm 61 bpm Infeerolateral T wave inversion, consider ischemia  Echocardiogram 01/08/2021:  Left ventricle cavity is normal in size and wall thickness. Normal global  wall motion. Normal LV systolic function with visual EF 50-55%. Normal  diastolic filling pattern.  Right atrial cavity is moderately dilated.  Right ventricle cavity is mildly dilated. Normal right ventricular  function.  Structurally normal trileaflet aortic valve. No evidence of aortic  stenosis. Moderate (Grade II) aortic regurgitation.  Moderate (Grade II) mitral regurgitation.  Moderate tricuspid regurgitation.  Mild pulmonic regurgitation.  Estimated pulmonary artery systolic pressure 33 mmHg.  No significant change compared to previous study on 01/27/2019.   Lexiscan myoview stress test 08/16/2018: 1. The resting electrocardiogram demonstrated normal sinus rhythm, PAC and normal rest repolarization. The stress electrocardiogram was strongly positive for myocardial ischemia. Patient developed SVT at the rate of 137 bpm during Lexiscan infusion that persisted for 6-8 minutes into recovery.  Associated with this she had 2 mm horizontal ST segment depression in the inferior and lateral leads which is resolved immediately into conversion to sinus rhythm.  Valsalva maneuver was performed  for termination.  Patient's symptoms included dyspnea and chest tightness.  Patient was hypotensive with blood pressure dropping from 122/70 mmHg to 100/80 mmHg. (see enclosed tracings) 2. Myocardial perfusion imaging is normal. Overall left ventricular systolic function was normal without regional wall motion abnormalities. The left ventricular ejection fraction was 69%.  This is an intermediate risk study, clinical correlation recommended. ST-T changes could be rate related vs demand ischemia.  Event monitor 07/16/2018- 08/04/2018:  Multiple episodes of SVT, one episode of 42 second with wide complex tachycardia-differential includes ventricular tachycardia versus SVT with aberrancy.  Frequent PACs and PVCs.  No atrial fibrillation, atrial flutter, high-grade AV block.   No symptoms reported.  EKG 07/16/2018: Sinus arrhythmia 55 bpm. Inferolateral T wave inversion. Cannot exclude ischemia.   Recent labs: 08/29/2020: Glucose N/A, BUN/Cr 18/1.09. EGFR 46. HbA1C N/A% Chol 287, TG 93, HDL 145, LDL 127 TSH 7.0normal  04/28/2018: H/H 13.6/39.6.  MCV 98.  Platelets 159. Glucose 88.  BUN/creatinine 20/1.11.  EGFR 46/53.  Sodium 141, potassium 4.7.  Rest of the CMP normal  Review of Systems  Cardiovascular:  Positive for dyspnea on exertion (Occasional) and palpitations (Occasional).        Vitals:   12/27/21 1104  BP: (!) 148/76  Pulse: 61  Resp: 16  Temp: 98 F (36.7 C)  SpO2: 97%    Physical Exam Vitals and nursing note reviewed.  Constitutional:      General: She is not in acute distress. Neck:     Vascular: No JVD.  Cardiovascular:     Rate and Rhythm: Normal rate and regular rhythm.     Heart sounds: Murmur heard.  High-pitched blowing holosystolic murmur is present at the apex.  High-pitched blowing decrescendo early diastolic murmur is present with a grade of 2/4 at the upper right sternal border radiating to the apex.  Pulmonary:     Effort: Pulmonary effort is  normal.     Breath sounds: Normal breath sounds. No wheezing or rales.          Assessment & Recommendations:  88 year old Caucasian female with hypertension, hypothyroidism, remote history of colon and breast cancer status post partial colectomy, left mastectomy, chemotherapy, SVT, exertional dyspnea and irregular heart beat  Exertional dyspnea: Intermittent.  No ischemia/infarction on stress test Unchanged echocardiogram with stable mod MR,TR. Angina equivalent is a possibility.  I suspect his symptoms may be related to multiple episodes of tachycardia, likely atrial tachycardia. Now improved.  Paroxysmal atrial tachycardia: Sinus arrhythmia on EKG today. No symptoms. Well controlled on metoprolol and diltiazem.  Continue the same.  If irregular heart beat, Afib noted on Apple watch, patient will let me know.   F/u in 6 months with repeat echocardiogram   Nigel Mormon, MD [facility] Pager: [phone] Office: [phone] If no answer Cell [phone]

## 2021-12-27 ENCOUNTER — Other Ambulatory Visit: Payer: Self-pay

## 2021-12-27 ENCOUNTER — Ambulatory Visit: Payer: Medicare Other | Admitting: Cardiology

## 2021-12-27 ENCOUNTER — Encounter: Payer: Self-pay | Admitting: Cardiology

## 2021-12-27 VITALS — BP 148/76 | HR 61 | Temp 98.0°F | Resp 16 | Ht 61.0 in | Wt 102.0 lb

## 2021-12-27 DIAGNOSIS — I071 Rheumatic tricuspid insufficiency: Secondary | ICD-10-CM

## 2021-12-27 DIAGNOSIS — I471 Supraventricular tachycardia: Secondary | ICD-10-CM

## 2021-12-27 MED ORDER — METOPROLOL SUCCINATE ER 25 MG PO TB24: 25.0000 mg | ORAL_TABLET | Freq: Every day | ORAL | 3 refills | Status: DC

## 2022-01-16 DIAGNOSIS — E039 Hypothyroidism, unspecified: Secondary | ICD-10-CM | POA: Diagnosis not present

## 2022-01-16 DIAGNOSIS — E559 Vitamin D deficiency, unspecified: Secondary | ICD-10-CM | POA: Diagnosis not present

## 2022-01-16 DIAGNOSIS — Z0000 Encounter for general adult medical examination without abnormal findings: Secondary | ICD-10-CM | POA: Diagnosis not present

## 2022-01-23 DIAGNOSIS — N183 Chronic kidney disease, stage 3 unspecified: Secondary | ICD-10-CM | POA: Diagnosis not present

## 2022-01-23 DIAGNOSIS — J449 Chronic obstructive pulmonary disease, unspecified: Secondary | ICD-10-CM | POA: Diagnosis not present

## 2022-01-23 DIAGNOSIS — I10 Essential (primary) hypertension: Secondary | ICD-10-CM | POA: Diagnosis not present

## 2022-01-23 DIAGNOSIS — Z85038 Personal history of other malignant neoplasm of large intestine: Secondary | ICD-10-CM | POA: Diagnosis not present

## 2022-01-23 DIAGNOSIS — E673 Hypervitaminosis D: Secondary | ICD-10-CM | POA: Diagnosis not present

## 2022-01-23 DIAGNOSIS — I471 Supraventricular tachycardia: Secondary | ICD-10-CM | POA: Diagnosis not present

## 2022-01-23 DIAGNOSIS — Z0000 Encounter for general adult medical examination without abnormal findings: Secondary | ICD-10-CM | POA: Diagnosis not present

## 2022-01-23 DIAGNOSIS — E039 Hypothyroidism, unspecified: Secondary | ICD-10-CM | POA: Diagnosis not present

## 2022-01-23 DIAGNOSIS — E559 Vitamin D deficiency, unspecified: Secondary | ICD-10-CM | POA: Diagnosis not present

## 2022-03-14 ENCOUNTER — Other Ambulatory Visit: Payer: Self-pay | Admitting: Cardiology

## 2022-03-14 DIAGNOSIS — I471 Supraventricular tachycardia: Secondary | ICD-10-CM

## 2022-03-24 DIAGNOSIS — H2513 Age-related nuclear cataract, bilateral: Secondary | ICD-10-CM | POA: Diagnosis not present

## 2022-03-26 DIAGNOSIS — E673 Hypervitaminosis D: Secondary | ICD-10-CM | POA: Diagnosis not present

## 2022-03-28 DIAGNOSIS — H25812 Combined forms of age-related cataract, left eye: Secondary | ICD-10-CM | POA: Diagnosis not present

## 2022-03-28 DIAGNOSIS — H25811 Combined forms of age-related cataract, right eye: Secondary | ICD-10-CM | POA: Diagnosis not present

## 2022-03-28 DIAGNOSIS — Z01818 Encounter for other preprocedural examination: Secondary | ICD-10-CM | POA: Diagnosis not present

## 2022-03-28 DIAGNOSIS — H35342 Macular cyst, hole, or pseudohole, left eye: Secondary | ICD-10-CM | POA: Diagnosis not present

## 2022-04-10 DIAGNOSIS — H25812 Combined forms of age-related cataract, left eye: Secondary | ICD-10-CM | POA: Diagnosis not present

## 2022-04-10 DIAGNOSIS — H2513 Age-related nuclear cataract, bilateral: Secondary | ICD-10-CM | POA: Diagnosis not present

## 2022-04-22 DIAGNOSIS — H2513 Age-related nuclear cataract, bilateral: Secondary | ICD-10-CM | POA: Diagnosis not present

## 2022-04-23 DIAGNOSIS — H35342 Macular cyst, hole, or pseudohole, left eye: Secondary | ICD-10-CM | POA: Diagnosis not present

## 2022-04-23 DIAGNOSIS — H25811 Combined forms of age-related cataract, right eye: Secondary | ICD-10-CM | POA: Diagnosis not present

## 2022-05-01 DIAGNOSIS — H25811 Combined forms of age-related cataract, right eye: Secondary | ICD-10-CM | POA: Diagnosis not present

## 2022-06-06 ENCOUNTER — Other Ambulatory Visit: Payer: Self-pay | Admitting: Cardiology

## 2022-06-06 DIAGNOSIS — I471 Supraventricular tachycardia: Secondary | ICD-10-CM

## 2022-06-16 ENCOUNTER — Ambulatory Visit: Payer: Medicare Other

## 2022-06-16 DIAGNOSIS — I471 Supraventricular tachycardia: Secondary | ICD-10-CM

## 2022-06-27 ENCOUNTER — Encounter: Payer: Self-pay | Admitting: Cardiology

## 2022-06-27 ENCOUNTER — Ambulatory Visit: Payer: Medicare Other | Admitting: Cardiology

## 2022-06-27 ENCOUNTER — Inpatient Hospital Stay: Payer: Medicare Other

## 2022-06-27 VITALS — BP 140/71 | HR 70 | Temp 97.8°F | Resp 16 | Ht 61.0 in | Wt 99.0 lb

## 2022-06-27 DIAGNOSIS — I471 Supraventricular tachycardia: Secondary | ICD-10-CM

## 2022-06-27 DIAGNOSIS — I351 Nonrheumatic aortic (valve) insufficiency: Secondary | ICD-10-CM | POA: Diagnosis not present

## 2022-06-27 DIAGNOSIS — I071 Rheumatic tricuspid insufficiency: Secondary | ICD-10-CM | POA: Diagnosis not present

## 2022-06-27 NOTE — Progress Notes (Signed)
Subjective:   [name], female    DOB: 01-19-1933, 88 y.o.   MRN: [id number]   Chief complaint:  Supraventricular tachycardia   HPI   88 year old Caucasian female with hypertension, hypothyroidism, remote history of colon and breast cancer status post partial colectomy, left mastectomy, chemotherapy, SVT, mod AI, TR  Patient is doing well, denies chest pain, shortness of breath, palpitations, leg edema, orthopnea, PND, TIA/syncope. She has hoarse voice today, that she attributes to allergies.  Reviewed recent test results with the patient, details below.     Current Outpatient Medications:    ADVAIR DISKUS 100-50 MCG/DOSE AEPB, USE 1 PUFF BY MOUTH EVERY12 HOURS. (Patient taking differently: Inhale 1 puff into the lungs every 12 (twelve) hours.), Disp: 60 each, Rfl: 0   albuterol (PROAIR HFA) 108 (90 BASE) MCG/ACT inhaler, Inhale 2 puffs into the lungs every 6 (six) hours as needed for wheezing. Needs to schedule an appt for refills. (Patient taking differently: Inhale 2 puffs into the lungs every 6 (six) hours as needed for wheezing.), Disp: 1 Inhaler, Rfl: 0   cyanocobalamin 1000 MCG tablet, Take 1,000 mcg by mouth daily., Disp: , Rfl:    diltiazem (TIAZAC) 180 MG 24 hr capsule, TAKE ONE CAPSULE BY MOUTH ONE TIME DAILY, Disp: 90 capsule, Rfl: 0   fluticasone (FLONASE) 50 MCG/ACT nasal spray, as needed., Disp: , Rfl:    levothyroxine (SYNTHROID) 112 MCG tablet, Take 112 mcg by mouth daily., Disp: , Rfl:    losartan-hydrochlorothiazide (HYZAAR) 100-25 MG per tablet, Take 1 tablet by mouth daily., Disp: , Rfl:    metoprolol succinate (TOPROL-XL) 25 MG 24 hr tablet, Take 1 tablet (25 mg total) by mouth daily., Disp: 90 tablet, Rfl: 3   Vitamin D, Cholecalciferol, 1000 units TABS, Take 1,000 Units by mouth daily. , Disp: , Rfl:   Cardiovascular studies:  EKG 06/27/2022: Sinus rhythm 75 bpm Run of atrial tachycardia  Old anteroseptal infarct Negative T-waves  -Possible   Anterolateral  ischemia  Echocardiogram 06/16/2022:  Left ventricle cavity is normal in size and wall thickness. Normal global  wall motion. Normal LV systolic function with EF 59%. Indeterminate  diastolic filling pattern. Calculated EF 59%.  Left atrial cavity is severely dilated.  Structurally normal trileaflet aortic valve.  Moderate (Grade II) aortic  regurgitation.  Mild to moderate mitral regurgitation.  Moderate tricuspid regurgitation. Estimated pulmonary artery systolic  pressure 32 mmHg.  Mild pulmonic regurgitation.  Previous study on 01/08/2021 showed moderate left atrial dilatation. No  other significant change noted.   Mobile cardiac telemetry 13 days 02/04/2021 - 02/18/2021: Dominant rhythm: Sinus. HR 42-91 bpm. Avg HR 62 bpm. 95 episodes of SVT-probable atrial tachycardia, fastest at 174 bpm for 6 min 13 sec, longest for 16 min 29 sec at 151 bpm. 6.1%% isolated SVE, <1 couplet/triplets. <1% isolated VE, no couplet/triplets. No atrial fibrillation/atrial flutter/VT/high grade AV block, sinus pause >3sec noted. 2 patient triggered events, correlated with SVE, SVT    Lexiscan Tetrofosmin Stress Test  02/04/2021: Nondiagnostic ECG stress. Myocardial perfusion is normal. Overall LV systolic function is normal without regional wall motion abnormalities. Stress LV EF: 74%.  No previous exam available for comparison. Low risk.   Recent labs: 08/29/2020: Glucose N/A, BUN/Cr 18/1.09. EGFR 46. HbA1C N/A% Chol 287, TG 93, HDL 145, LDL 127 TSH 7.0normal  Review of Systems  Cardiovascular:  Negative for dyspnea on exertion and palpitations.         Vitals:   06/27/22 1023  BP: 140/71  Pulse: 70  Resp: 16  Temp: 97.8 F (36.6 C)  SpO2: 95%    Physical Exam Vitals and nursing note reviewed.  Constitutional:      General: She is not in acute distress. Neck:     Vascular: No JVD.  Cardiovascular:     Rate and Rhythm: Normal rate and regular rhythm. Occasional  Extrasystoles are present.    Heart sounds: Murmur heard.     High-pitched blowing holosystolic murmur is present at the apex.     High-pitched blowing decrescendo early diastolic murmur is present with a grade of 2/4 at the upper right sternal border radiating to the apex.  Pulmonary:     Effort: Pulmonary effort is normal.     Breath sounds: Normal breath sounds. No wheezing or rales.           Assessment & Recommendations:  88 year old Caucasian female with hypertension, hypothyroidism, remote history of colon and breast cancer status post partial colectomy, left mastectomy, chemotherapy, SVT, mod AI, TR  Paroxysmal atrial tachycardia: Sinus rhythm with atrial run on EKG today. Given severe LA dilatation on echocardiogram, I will repeat cardiac monitor to look for any Afib.  F/u in 4 weeks   [place], MD [facility] Pager: [phone] Office: [phone] If no answer Cell [phone]

## 2022-07-13 ENCOUNTER — Ambulatory Visit
Admission: EM | Admit: 2022-07-13 | Discharge: 2022-07-13 | Disposition: A | Discharge status: Home / Self Care | Payer: Medicare Other | Attending: Urgent Care | Admitting: Urgent Care

## 2022-07-13 ENCOUNTER — Encounter: Payer: Self-pay | Admitting: Emergency Medicine

## 2022-07-13 ENCOUNTER — Ambulatory Visit (INDEPENDENT_AMBULATORY_CARE_PROVIDER_SITE_OTHER): Payer: Medicare Other

## 2022-07-13 DIAGNOSIS — M25561 Pain in right knee: Secondary | ICD-10-CM | POA: Diagnosis not present

## 2022-07-13 DIAGNOSIS — W19XXXA Unspecified fall, initial encounter: Secondary | ICD-10-CM | POA: Diagnosis not present

## 2022-07-13 DIAGNOSIS — S82001A Unspecified fracture of right patella, initial encounter for closed fracture: Secondary | ICD-10-CM

## 2022-07-13 MED ORDER — ACETAMINOPHEN 325 MG PO TABS: 650.0000 mg | ORAL_TABLET | Freq: Four times a day (QID) | ORAL | 0 refills | Status: AC | PRN

## 2022-07-13 NOTE — ED Provider Notes (Signed)
[place]   MRN: [id number] DOB: 11-05-1933  Subjective:   [name] is a 88 y.o. female presenting for severe right knee pain since yesterday.  Patient fell yesterday coming out of a go-cart and landed directly onto her right knee.  She has since had significant difficulty bearing any kind of weight on the knee. No head injury, loss of consciousness, headache, vision changes. Has history of CKD. Tdap is up to date.   No current facility-administered medications for this encounter.  Current Outpatient Medications:    ADVAIR DISKUS 100-50 MCG/DOSE AEPB, USE 1 PUFF BY MOUTH EVERY12 HOURS. (Patient taking differently: Inhale 1 puff into the lungs every 12 (twelve) hours.), Disp: 60 each, Rfl: 0   albuterol (PROAIR HFA) 108 (90 BASE) MCG/ACT inhaler, Inhale 2 puffs into the lungs every 6 (six) hours as needed for wheezing. Needs to schedule an appt for refills. (Patient taking differently: Inhale 2 puffs into the lungs every 6 (six) hours as needed for wheezing.), Disp: 1 Inhaler, Rfl: 0   cyanocobalamin 1000 MCG tablet, Take 1,000 mcg by mouth daily., Disp: , Rfl:    diltiazem (TIAZAC) 180 MG 24 hr capsule, TAKE ONE CAPSULE BY MOUTH ONE TIME DAILY, Disp: 90 capsule, Rfl: 0   fluticasone (FLONASE) 50 MCG/ACT nasal spray, as needed., Disp: , Rfl:    levothyroxine (SYNTHROID) 112 MCG tablet, Take 112 mcg by mouth daily., Disp: , Rfl:    losartan-hydrochlorothiazide (HYZAAR) 100-25 MG per tablet, Take 1 tablet by mouth daily., Disp: , Rfl:    metoprolol succinate (TOPROL-XL) 25 MG 24 hr tablet, Take 1 tablet (25 mg total) by mouth daily., Disp: 90 tablet, Rfl: 3   Vitamin D, Cholecalciferol, 1000 units TABS, Take 1,000 Units by mouth daily. , Disp: , Rfl:    No Active Allergies  Past Medical History:  Diagnosis Date   Allergy    Arthritis    Asthma    Blood transfusion 2004   Breast cancer ([place])    1984.  right mastectomy   Colon cancer ([place])    2004    COPD (chronic obstructive pulmonary disease) ([place])    Dysrhythmia    racing heartbeats at times, takes metoprolol   Hypertension    Hypothyroidism    Osteopenia      Past Surgical History:  Procedure Laterality Date   COLON RESECTION  2004   COLON SURGERY     rt hemicolectomy for Ca   HYSTERECTOMY ABDOMINAL WITH SALPINGO-OOPHORECTOMY     MASTECTOMY Right    rt   NASAL POLYP EXCISION     OPEN REDUCTION INTERNAL FIXATION (ORIF) DISTAL PHALANX Right 08/21/2016   Procedure: OPEN REDUCTION INTERNAL FIXATION (ORIF) RIGHT RADIUS DISTAL PHALANX;  Surgeon: Leanora Cover, MD;  Location: [place];  Service: Orthopedics;  Laterality: Right;   VESICOVAGINAL FISTULA CLOSURE W/ TAH      Family History  Problem Relation Age of Onset   Allergies Mother    Asthma Mother    Kidney cancer Mother        mets   Heart attack Mother    Liver cancer Mother    Heart attack Father    Colon cancer Neg Hx    Stomach cancer Neg Hx     Social History   Tobacco Use   Smoking status: Former    Packs/day: 0.25    Years: 1.00    Total pack years: 0.25    Types: Cigarettes  Quit date: 12/08/1982    Years since quitting: 39.6   Smokeless tobacco: Never   Tobacco comments:    smoked "on and off socially"  Vaping Use   Vaping Use: Never used  Substance Use Topics   Alcohol use: Yes    Alcohol/week: 7.0 standard drinks of alcohol    Types: 7 Standard drinks or equivalent per week    Comment: wine 1 per day   Drug use: No    ROS   Objective:   Vitals: BP (!) 169/95   Pulse (!) 105   Temp 98.6 F (37 C)   Resp 20   SpO2 98%   Physical Exam Constitutional:      General: She is not in acute distress.    Appearance: Normal appearance. She is well-developed. She is not ill-appearing, toxic-appearing or diaphoretic.  HENT:     Head: Normocephalic and atraumatic.     Nose: Nose normal.     Mouth/Throat:     Mouth: Mucous membranes are moist.  Eyes:     General: No  scleral icterus.       Right eye: No discharge.        Left eye: No discharge.     Extraocular Movements: Extraocular movements intact.  Cardiovascular:     Rate and Rhythm: Normal rate.  Pulmonary:     Effort: Pulmonary effort is normal.  Musculoskeletal:     Right knee: Swelling, deformity, bony tenderness and crepitus present. No effusion, erythema, ecchymosis or lacerations. Decreased range of motion. Tenderness (directly over the patella is the worst) present over the medial joint line, lateral joint line and patellar tendon. Abnormal patellar mobility. Normal alignment.     Right lower leg: No swelling, deformity, lacerations, tenderness or bony tenderness. No edema.     Right ankle: No swelling, deformity, ecchymosis or lacerations. No tenderness. Normal range of motion. Anterior drawer test negative. Normal pulse.     Right Achilles Tendon: No tenderness or defects. Thompson's test negative.  Skin:    General: Skin is warm and dry.  Neurological:     General: No focal deficit present.     Mental Status: She is alert and oriented to person, place, and time.     Cranial Nerves: No cranial nerve deficit.     Comments: Unable to bear weight on the right leg.  Psychiatric:        Mood and Affect: Mood normal.        Behavior: Behavior normal.    DG Knee Complete 4 Views Right  Result Date: 07/13/2022 CLINICAL DATA:  Right knee pain. EXAM: RIGHT KNEE - COMPLETE 4+ VIEW COMPARISON:  None Available. FINDINGS: Image quality is degraded on all views by some form of artifact. There is an oblique, nondisplaced acute fracture of the patella associated with a moderate sized lipohemarthrosis. Ossific density lateral to the lateral tibial plateau is not optimally evaluated due to the quality of the images, but is not suspected to be acute. There are mild underlying tricompartmental degenerative changes. Femoropopliteal atherosclerosis noted. IMPRESSION: Nondisplaced acute fracture of the patella  with associated lipohemarthrosis. This fracture has the propensity to distract with continue weight-bearing or knee extension. Electronically Signed   By: Richardean Sale M.D.   On: 07/13/2022 11:29    Patient placed into a right leg immobilizer.  Assessment and Plan :   PDMP not reviewed this encounter.  1. Acute pain of right knee   2. Closed nondisplaced fracture of right patella, unspecified  fracture morphology, initial encounter   3. Accidental fall, initial encounter    Leg immobilized as above.  She is to follow-up closely with Dr. Sammuel Hines.  I did call discussed, reviewed the case with him given the caution on the radiology over read. Dr. Sammuel Hines confirmed that it is appropriate and necessary to immobilize the leg in extension.  Tdap is already up-to-date.  Tylenol for pain relief. Counseled patient on potential for adverse effects with medications prescribed/recommended today, ER and return-to-clinic precautions discussed, patient verbalized understanding.    Jaynee Eagles, PA-C 07/13/22 1228

## 2022-07-13 NOTE — Discharge Instructions (Addendum)
Please wear the leg immobilizer at all times. Do not use any nonsteroidal anti-inflammatories (NSAIDs) like ibuprofen, Motrin, naproxen, Aleve, etc. which are all available over-the-counter.  Please just use Tylenol at a dose of '650mg'$  once every 6 hours as needed for your aches, pains, fevers. Make sure you follow up with Dr. Sammuel Hines as soon as possible.

## 2022-07-13 NOTE — ED Triage Notes (Signed)
Pt here after her and her husband fell out of a go cart after it stopped last night. Pt is unable to put weight on right leg do to knee swelling and pain.

## 2022-07-16 DIAGNOSIS — I471 Supraventricular tachycardia: Secondary | ICD-10-CM | POA: Diagnosis not present

## 2022-07-18 ENCOUNTER — Ambulatory Visit (INDEPENDENT_AMBULATORY_CARE_PROVIDER_SITE_OTHER): Payer: Medicare Other | Admitting: Orthopaedic Surgery

## 2022-07-18 DIAGNOSIS — S82034A Nondisplaced transverse fracture of right patella, initial encounter for closed fracture: Secondary | ICD-10-CM | POA: Diagnosis not present

## 2022-07-18 NOTE — Progress Notes (Addendum)
Chief Complaint: Right knee patella fracture     History of Present Illness:    [name] is a 88 y.o. female presents with a right knee patella fracture after a fall.  She immediately went to the [facility] emergency room was placed in a knee immobilizer.  She has been weightbearing as tolerated on the knee since that time.  She is here today for further assessment. She was ambulatory prior to this injury.    Surgical History:   None  PMH/PSH/Family History/Social History/Meds/Allergies:    Past Medical History:  Diagnosis Date   Allergy    Arthritis    Asthma    Blood transfusion 2004   Breast cancer ([place])    1984.  right mastectomy   Colon cancer ([place])    2004   COPD (chronic obstructive pulmonary disease) ([place])    Dysrhythmia    racing heartbeats at times, takes metoprolol   Hypertension    Hypothyroidism    Osteopenia    Past Surgical History:  Procedure Laterality Date   COLON RESECTION  2004   COLON SURGERY     rt hemicolectomy for Ca   HYSTERECTOMY ABDOMINAL WITH SALPINGO-OOPHORECTOMY     MASTECTOMY Right    rt   NASAL POLYP EXCISION     OPEN REDUCTION INTERNAL FIXATION (ORIF) DISTAL PHALANX Right 08/21/2016   Procedure: OPEN REDUCTION INTERNAL FIXATION (ORIF) RIGHT RADIUS DISTAL PHALANX;  Surgeon: Leanora Cover, MD;  Location: [place];  Service: Orthopedics;  Laterality: Right;   VESICOVAGINAL FISTULA CLOSURE W/ TAH     Social History   Socioeconomic History   Marital status: Married    Spouse name: Not on file   Number of children: 5   Years of education: Not on file   Highest education level: Not on file  Occupational History   Occupation: Retired from daycare  Tobacco Use   Smoking status: Former    Packs/day: 0.25    Years: 1.00    Total pack years: 0.25    Types: Cigarettes    Quit date: 12/08/1982    Years since quitting: 39.6   Smokeless tobacco: Never   Tobacco comments:     smoked "on and off socially"  Vaping Use   Vaping Use: Never used  Substance and Sexual Activity   Alcohol use: Yes    Alcohol/week: 7.0 standard drinks of alcohol    Types: 7 Standard drinks or equivalent per week    Comment: wine 1 per day   Drug use: No   Sexual activity: Not on file  Other Topics Concern   Not on file  Social History Narrative   Not on file   Social Determinants of Health   Financial Resource Strain: Not on file  Food Insecurity: Not on file  Transportation Needs: Not on file  Physical Activity: Not on file  Stress: Not on file  Social Connections: Not on file   Family History  Problem Relation Age of Onset   Allergies Mother    Asthma Mother    Kidney cancer Mother        mets   Heart attack Mother    Liver cancer Mother    Heart attack Father    Colon cancer Neg Hx    Stomach cancer Neg Hx  No Active Allergies Current Outpatient Medications  Medication Sig Dispense Refill   acetaminophen (TYLENOL) 325 MG tablet Take 2 tablets (650 mg total) by mouth every 6 (six) hours as needed for moderate pain. 30 tablet 0   ADVAIR DISKUS 100-50 MCG/DOSE AEPB USE 1 PUFF BY MOUTH EVERY12 HOURS. (Patient taking differently: Inhale 1 puff into the lungs every 12 (twelve) hours.) 60 each 0   albuterol (PROAIR HFA) 108 (90 BASE) MCG/ACT inhaler Inhale 2 puffs into the lungs every 6 (six) hours as needed for wheezing. Needs to schedule an appt for refills. (Patient taking differently: Inhale 2 puffs into the lungs every 6 (six) hours as needed for wheezing.) 1 Inhaler 0   cyanocobalamin 1000 MCG tablet Take 1,000 mcg by mouth daily.     diltiazem (TIAZAC) 180 MG 24 hr capsule TAKE ONE CAPSULE BY MOUTH ONE TIME DAILY 90 capsule 0   fluticasone (FLONASE) 50 MCG/ACT nasal spray as needed.     levothyroxine (SYNTHROID) 112 MCG tablet Take 112 mcg by mouth daily.     losartan-hydrochlorothiazide (HYZAAR) 100-25 MG per tablet Take 1 tablet by mouth daily.     metoprolol  succinate (TOPROL-XL) 25 MG 24 hr tablet Take 1 tablet (25 mg total) by mouth daily. 90 tablet 3   Vitamin D, Cholecalciferol, 1000 units TABS Take 1,000 Units by mouth daily.      No current facility-administered medications for this visit.   No results found.  Review of Systems:   A ROS was performed including pertinent positives and negatives as documented in the HPI.  Physical Exam :   Constitutional: NAD and appears stated age Neurological: Alert and oriented Psych: Appropriate affect and cooperative There were no vitals taken for this visit.   Comprehensive Musculoskeletal Exam:    Tenderness palpation about the right patella.  Distal neurovascular exam is intact.  There is a small abrasion over the central patella.  2+ dorsalis pedis pulse.  Imaging:   Xray (4 views right knee): Transverse patella fracture with minimal displacement   I personally reviewed and interpreted the radiographs.   Assessment:   88 y.o. female with a right knee patella fracture that is relatively nondisplaced.  To this effect I do believe that she would be a candidate for closed management.  I will plan to transition her into a hinged knee brace and she will continue to weight-bear as tolerated.  I have advised against flexion beyond 90 degrees.  I will plan to see her back in 6 weeks for reassessment  Plan :    -Return to clinic in 6 weeks for reassessment     I personally saw and evaluated the patient, and participated in the management and treatment plan.  Vanetta Mulders, MD Attending Physician, Orthopedic Surgery  This document was dictated using Dragon voice recognition software. A reasonable attempt at proof reading has been made to minimize errors.

## 2022-08-06 ENCOUNTER — Encounter: Payer: Self-pay | Admitting: Cardiology

## 2022-08-06 ENCOUNTER — Ambulatory Visit: Payer: Medicare Other | Admitting: Cardiology

## 2022-08-06 VITALS — BP 148/68 | HR 61 | Temp 98.0°F | Resp 17 | Ht 61.0 in | Wt 99.0 lb

## 2022-08-06 DIAGNOSIS — I471 Supraventricular tachycardia: Secondary | ICD-10-CM

## 2022-08-06 NOTE — Progress Notes (Signed)
Subjective:   [name], female    DOB: 21-Nov-1933, 88 y.o.   MRN: [id number]   Chief complaint:  Supraventricular tachycardia   HPI   88 year old Caucasian female with hypertension, hypothyroidism, remote history of colon and breast cancer status post partial colectomy, left mastectomy, chemotherapy, SVT, mod AI, TR  Patient has no symptoms today. Denies palpitations, dyspnea. Reviewed recent test results with the patient, details below.      Current Outpatient Medications:    acetaminophen (TYLENOL) 325 MG tablet, Take 2 tablets (650 mg total) by mouth every 6 (six) hours as needed for moderate pain., Disp: 30 tablet, Rfl: 0   ADVAIR DISKUS 100-50 MCG/DOSE AEPB, USE 1 PUFF BY MOUTH EVERY12 HOURS. (Patient taking differently: Inhale 1 puff into the lungs every 12 (twelve) hours.), Disp: 60 each, Rfl: 0   albuterol (PROAIR HFA) 108 (90 BASE) MCG/ACT inhaler, Inhale 2 puffs into the lungs every 6 (six) hours as needed for wheezing. Needs to schedule an appt for refills. (Patient taking differently: Inhale 2 puffs into the lungs every 6 (six) hours as needed for wheezing.), Disp: 1 Inhaler, Rfl: 0   cyanocobalamin 1000 MCG tablet, Take 1,000 mcg by mouth daily., Disp: , Rfl:    diltiazem (TIAZAC) 180 MG 24 hr capsule, TAKE ONE CAPSULE BY MOUTH ONE TIME DAILY, Disp: 90 capsule, Rfl: 0   fluticasone (FLONASE) 50 MCG/ACT nasal spray, as needed., Disp: , Rfl:    levothyroxine (SYNTHROID) 112 MCG tablet, Take 112 mcg by mouth daily., Disp: , Rfl:    losartan-hydrochlorothiazide (HYZAAR) 100-25 MG per tablet, Take 1 tablet by mouth daily., Disp: , Rfl:    metoprolol succinate (TOPROL-XL) 25 MG 24 hr tablet, Take 1 tablet (25 mg total) by mouth daily., Disp: 90 tablet, Rfl: 3   Vitamin D, Cholecalciferol, 1000 units TABS, Take 1,000 Units by mouth daily. , Disp: , Rfl:   Cardiovascular studies:  Mobile cardiac telemetry 13 days 06/27/2022 - 07/11/2022: Dominant rhythm: Sinus. HR  41-89 bpm. Avg HR 63 bpm, in sinus rhythm. >2000 episodes of SVT/atrial tachycardia, fastest at 187 bpm for 3 min 13 sec, longest for 7 min 13 sec at 138 bpm. 14.4% isolated SVE, 4.7% couplets, 3% triplets. 0 episodes of VT. <1% isolated VE, couplets. No convincing evidence of atrial fibrillation. Some episodes marked as AF are likely atrial tachycardia with variable AV conduction  No VT/high grade AV block, sinus pause >3sec noted. 0 patient triggered events.   EKG 06/27/2022: Sinus rhythm 75 bpm Run of atrial tachycardia  Old anteroseptal infarct Negative T-waves  -Possible  Anterolateral  ischemia  Echocardiogram 06/16/2022:  Left ventricle cavity is normal in size and wall thickness. Normal global  wall motion. Normal LV systolic function with EF 59%. Indeterminate  diastolic filling pattern. Calculated EF 59%.  Left atrial cavity is severely dilated.  Structurally normal trileaflet aortic valve.  Moderate (Grade II) aortic  regurgitation.  Mild to moderate mitral regurgitation.  Moderate tricuspid regurgitation. Estimated pulmonary artery systolic  pressure 32 mmHg.  Mild pulmonic regurgitation.  Previous study on 01/08/2021 showed moderate left atrial dilatation. No  other significant change noted.   Mobile cardiac telemetry 13 days 02/04/2021 - 02/18/2021: Dominant rhythm: Sinus. HR 42-91 bpm. Avg HR 62 bpm. 95 episodes of SVT-probable atrial tachycardia, fastest at 174 bpm for 6 min 13 sec, longest for 16 min 29 sec at 151 bpm. 6.1%% isolated SVE, <1 couplet/triplets. <1% isolated VE, no couplet/triplets. No atrial fibrillation/atrial flutter/VT/high grade  AV block, sinus pause >3sec noted. 2 patient triggered events, correlated with SVE, SVT    Lexiscan Tetrofosmin Stress Test  02/04/2021: Nondiagnostic ECG stress. Myocardial perfusion is normal. Overall LV systolic function is normal without regional wall motion abnormalities. Stress LV EF: 74%.  No previous exam  available for comparison. Low risk.   Recent labs: 08/29/2020: Glucose N/A, BUN/Cr 18/1.09. EGFR 46. HbA1C N/A% Chol 287, TG 93, HDL 145, LDL 127 TSH 7.0normal  Review of Systems  Cardiovascular:  Negative for dyspnea on exertion and palpitations.         Vitals:   08/06/22 0941  BP: (!) 148/68  Pulse: 61  Resp: 17  Temp: 98 F (36.7 C)  SpO2: 95%    Physical Exam Vitals and nursing note reviewed.  Constitutional:      General: She is not in acute distress. Neck:     Vascular: No JVD.  Cardiovascular:     Rate and Rhythm: Normal rate and regular rhythm. Occasional Extrasystoles are present.    Heart sounds: Murmur heard.     High-pitched blowing holosystolic murmur is present at the apex.     High-pitched blowing decrescendo early diastolic murmur is present with a grade of 2/4 at the upper right sternal border radiating to the apex.  Pulmonary:     Effort: Pulmonary effort is normal.     Breath sounds: Normal breath sounds. No wheezing or rales.           Assessment & Recommendations:  88 year old Caucasian female with hypertension, hypothyroidism, remote history of colon and breast cancer status post partial colectomy, left mastectomy, chemotherapy, SVT, mod AI, TR  Paroxysmal atrial tachycardia: Reviewed cardiac telemetry. No convincing evidence of atrial fibrillation.  No anticoagulation recommended at this time.  Continue diltiazem and metoprolol.  F/u in 6 months   Halie Gass Esther [place], MD [facility] [place] Pager: [phone] Office: [phone] If no answer Cell [phone]

## 2022-08-29 ENCOUNTER — Ambulatory Visit (INDEPENDENT_AMBULATORY_CARE_PROVIDER_SITE_OTHER): Payer: Medicare Other | Admitting: Orthopaedic Surgery

## 2022-08-29 ENCOUNTER — Ambulatory Visit (INDEPENDENT_AMBULATORY_CARE_PROVIDER_SITE_OTHER): Payer: Medicare Other

## 2022-08-29 DIAGNOSIS — S82041A Displaced comminuted fracture of right patella, initial encounter for closed fracture: Secondary | ICD-10-CM | POA: Diagnosis not present

## 2022-08-29 DIAGNOSIS — S82034A Nondisplaced transverse fracture of right patella, initial encounter for closed fracture: Secondary | ICD-10-CM | POA: Diagnosis not present

## 2022-08-29 NOTE — Progress Notes (Signed)
Chief Complaint: Right knee patella fracture     History of Present Illness:   08/29/2022: Presents today for follow-up of her right knee.  Overall is doing extremely well.  She is now walking without any assistive devices.  She has no pain in the knee.  She is using an over-the-counter knee sleeve.  [name] is a 88 y.o. female presents with a right knee patella fracture after a fall.  She immediately went to the [facility] emergency room was placed in a knee immobilizer.  She has been weightbearing as tolerated on the knee since that time.  She is here today for further assessment. She was ambulatory prior to this injury.    Surgical History:   None  PMH/PSH/Family History/Social History/Meds/Allergies:    Past Medical History:  Diagnosis Date   Allergy    Arthritis    Asthma    Blood transfusion 2004   Breast cancer ([place])    1984.  right mastectomy   Colon cancer ([place])    2004   COPD (chronic obstructive pulmonary disease) ([place])    Dysrhythmia    racing heartbeats at times, takes metoprolol   Hypertension    Hypothyroidism    Osteopenia    Past Surgical History:  Procedure Laterality Date   COLON RESECTION  2004   COLON SURGERY     rt hemicolectomy for Ca   HYSTERECTOMY ABDOMINAL WITH SALPINGO-OOPHORECTOMY     MASTECTOMY Right    rt   NASAL POLYP EXCISION     OPEN REDUCTION INTERNAL FIXATION (ORIF) DISTAL PHALANX Right 08/21/2016   Procedure: OPEN REDUCTION INTERNAL FIXATION (ORIF) RIGHT RADIUS DISTAL PHALANX;  Surgeon: Leanora Cover, MD;  Location: [place];  Service: Orthopedics;  Laterality: Right;   VESICOVAGINAL FISTULA CLOSURE W/ TAH     Social History   Socioeconomic History   Marital status: Married    Spouse name: Not on file   Number of children: 5   Years of education: Not on file   Highest education level: Not on file  Occupational History   Occupation: Retired from daycare  Tobacco Use    Smoking status: Former    Packs/day: 0.25    Years: 1.00    Total pack years: 0.25    Types: Cigarettes    Quit date: 12/08/1982    Years since quitting: 39.7   Smokeless tobacco: Never   Tobacco comments:    smoked "on and off socially"  Vaping Use   Vaping Use: Never used  Substance and Sexual Activity   Alcohol use: Yes    Alcohol/week: 7.0 standard drinks of alcohol    Types: 7 Standard drinks or equivalent per week    Comment: wine 1 per day   Drug use: No   Sexual activity: Not on file  Other Topics Concern   Not on file  Social History Narrative   Not on file   Social Determinants of Health   Financial Resource Strain: Not on file  Food Insecurity: Not on file  Transportation Needs: Not on file  Physical Activity: Not on file  Stress: Not on file  Social Connections: Not on file   Family History  Problem Relation Age of Onset   Allergies Mother    Asthma Mother    Kidney cancer Mother  mets   Heart attack Mother    Liver cancer Mother    Heart attack Father    Colon cancer Neg Hx    Stomach cancer Neg Hx    No Active Allergies Current Outpatient Medications  Medication Sig Dispense Refill   acetaminophen (TYLENOL) 325 MG tablet Take 2 tablets (650 mg total) by mouth every 6 (six) hours as needed for moderate pain. 30 tablet 0   ADVAIR DISKUS 100-50 MCG/DOSE AEPB USE 1 PUFF BY MOUTH EVERY12 HOURS. (Patient taking differently: Inhale 1 puff into the lungs every 12 (twelve) hours.) 60 each 0   albuterol (PROAIR HFA) 108 (90 BASE) MCG/ACT inhaler Inhale 2 puffs into the lungs every 6 (six) hours as needed for wheezing. Needs to schedule an appt for refills. (Patient taking differently: Inhale 2 puffs into the lungs every 6 (six) hours as needed for wheezing.) 1 Inhaler 0   cyanocobalamin 1000 MCG tablet Take 1,000 mcg by mouth daily.     diltiazem (TIAZAC) 180 MG 24 hr capsule TAKE ONE CAPSULE BY MOUTH ONE TIME DAILY 90 capsule 0   fluticasone (FLONASE)  50 MCG/ACT nasal spray as needed.     levothyroxine (SYNTHROID) 112 MCG tablet Take 112 mcg by mouth daily.     losartan-hydrochlorothiazide (HYZAAR) 100-25 MG per tablet Take 1 tablet by mouth daily.     metoprolol succinate (TOPROL-XL) 25 MG 24 hr tablet Take 1 tablet (25 mg total) by mouth daily. 90 tablet 3   Vitamin D, Cholecalciferol, 1000 units TABS Take 1,000 Units by mouth daily.      No current facility-administered medications for this visit.   No results found.  Review of Systems:   A ROS was performed including pertinent positives and negatives as documented in the HPI.  Physical Exam :   Constitutional: NAD and appears stated age Neurological: Alert and oriented Psych: Appropriate affect and cooperative There were no vitals taken for this visit.   Comprehensive Musculoskeletal Exam:    No  tenderness palpation about the right patella.  Range of motion actively is from 0 to 125 degrees without pain.  Mild crepitus.  Distal neurovascular exam is intact.  There is a small abrasion over the central patella.  2+ dorsalis pedis pulse.  Imaging:   Xray (4 views right knee): Transverse patella fracture with minimal displacement with interval callus formation   I personally reviewed and interpreted the radiographs.   Assessment:   88 y.o. female with a right knee patella fracture that is now subsequently healed with interval callus formation.  Overall clinically she is doing extremely well.  I will plan to see her back on an as-needed basis.  She may discontinue her brace at this time  Plan :    -Return to clinic as needed     I personally saw and evaluated the patient, and participated in the management and treatment plan.  Vanetta Mulders, MD Attending Physician, Orthopedic Surgery  This document was dictated using Dragon voice recognition software. A reasonable attempt at proof reading has been made to minimize errors.

## 2022-09-04 ENCOUNTER — Other Ambulatory Visit: Payer: Self-pay | Admitting: Cardiology

## 2022-09-04 DIAGNOSIS — I471 Supraventricular tachycardia: Secondary | ICD-10-CM

## 2022-09-11 DIAGNOSIS — R0981 Nasal congestion: Secondary | ICD-10-CM | POA: Diagnosis not present

## 2022-09-11 DIAGNOSIS — Z9889 Other specified postprocedural states: Secondary | ICD-10-CM | POA: Diagnosis not present

## 2022-09-11 DIAGNOSIS — J338 Other polyp of sinus: Secondary | ICD-10-CM | POA: Diagnosis not present

## 2022-09-11 DIAGNOSIS — J330 Polyp of nasal cavity: Secondary | ICD-10-CM | POA: Diagnosis not present

## 2022-09-30 DIAGNOSIS — J329 Chronic sinusitis, unspecified: Secondary | ICD-10-CM | POA: Diagnosis not present

## 2022-09-30 DIAGNOSIS — J339 Nasal polyp, unspecified: Secondary | ICD-10-CM | POA: Diagnosis not present

## 2022-09-30 DIAGNOSIS — Z9889 Other specified postprocedural states: Secondary | ICD-10-CM | POA: Diagnosis not present

## 2022-09-30 DIAGNOSIS — R0981 Nasal congestion: Secondary | ICD-10-CM | POA: Diagnosis not present

## 2022-09-30 DIAGNOSIS — J330 Polyp of nasal cavity: Secondary | ICD-10-CM | POA: Diagnosis not present

## 2022-09-30 DIAGNOSIS — J0140 Acute pansinusitis, unspecified: Secondary | ICD-10-CM | POA: Diagnosis not present

## 2022-11-26 DIAGNOSIS — S42294A Other nondisplaced fracture of upper end of right humerus, initial encounter for closed fracture: Secondary | ICD-10-CM | POA: Diagnosis not present

## 2022-12-03 DIAGNOSIS — S42294D Other nondisplaced fracture of upper end of right humerus, subsequent encounter for fracture with routine healing: Secondary | ICD-10-CM | POA: Diagnosis not present

## 2022-12-05 ENCOUNTER — Other Ambulatory Visit: Payer: Self-pay | Admitting: Cardiology

## 2022-12-05 DIAGNOSIS — I471 Supraventricular tachycardia, unspecified: Secondary | ICD-10-CM

## 2023-01-12 ENCOUNTER — Other Ambulatory Visit (HOSPITAL_BASED_OUTPATIENT_CLINIC_OR_DEPARTMENT_OTHER): Payer: Self-pay

## 2023-01-12 DIAGNOSIS — R2689 Other abnormalities of gait and mobility: Secondary | ICD-10-CM | POA: Diagnosis not present

## 2023-01-12 DIAGNOSIS — R296 Repeated falls: Secondary | ICD-10-CM

## 2023-01-12 DIAGNOSIS — R300 Dysuria: Secondary | ICD-10-CM | POA: Diagnosis not present

## 2023-01-12 DIAGNOSIS — R195 Other fecal abnormalities: Secondary | ICD-10-CM | POA: Diagnosis not present

## 2023-01-12 DIAGNOSIS — R413 Other amnesia: Secondary | ICD-10-CM

## 2023-01-15 ENCOUNTER — Emergency Department (HOSPITAL_COMMUNITY): Payer: Medicare Other

## 2023-01-15 ENCOUNTER — Ambulatory Visit: Payer: Medicare Other | Admitting: Cardiology

## 2023-01-15 ENCOUNTER — Emergency Department (HOSPITAL_COMMUNITY)
Admission: EM | Admit: 2023-01-15 | Discharge: 2023-01-15 | Disposition: A | Discharge status: Home / Self Care | Payer: Medicare Other | Attending: Emergency Medicine | Admitting: Emergency Medicine

## 2023-01-15 DIAGNOSIS — Z79890 Hormone replacement therapy (postmenopausal): Secondary | ICD-10-CM | POA: Insufficient documentation

## 2023-01-15 DIAGNOSIS — E039 Hypothyroidism, unspecified: Secondary | ICD-10-CM | POA: Insufficient documentation

## 2023-01-15 DIAGNOSIS — R7989 Other specified abnormal findings of blood chemistry: Secondary | ICD-10-CM | POA: Diagnosis not present

## 2023-01-15 DIAGNOSIS — I959 Hypotension, unspecified: Secondary | ICD-10-CM | POA: Diagnosis not present

## 2023-01-15 DIAGNOSIS — R410 Disorientation, unspecified: Secondary | ICD-10-CM | POA: Diagnosis not present

## 2023-01-15 DIAGNOSIS — R778 Other specified abnormalities of plasma proteins: Secondary | ICD-10-CM | POA: Diagnosis not present

## 2023-01-15 DIAGNOSIS — F32A Depression, unspecified: Secondary | ICD-10-CM | POA: Diagnosis not present

## 2023-01-15 DIAGNOSIS — R519 Headache, unspecified: Secondary | ICD-10-CM | POA: Diagnosis not present

## 2023-01-15 DIAGNOSIS — R0789 Other chest pain: Secondary | ICD-10-CM | POA: Diagnosis not present

## 2023-01-15 DIAGNOSIS — R079 Chest pain, unspecified: Secondary | ICD-10-CM | POA: Diagnosis not present

## 2023-01-15 DIAGNOSIS — I471 Supraventricular tachycardia, unspecified: Secondary | ICD-10-CM | POA: Diagnosis not present

## 2023-01-15 LAB — CBC WITH DIFFERENTIAL/PLATELET
Abs Immature Granulocytes: 0.03 10*3/uL (ref 0.00–0.07)
Basophils Absolute: 0 10*3/uL (ref 0.0–0.1)
Basophils Relative: 0 %
Eosinophils Absolute: 0.1 10*3/uL (ref 0.0–0.5)
Eosinophils Relative: 1 %
HCT: 38.1 % (ref 36.0–46.0)
Hemoglobin: 13.1 g/dL (ref 12.0–15.0)
Immature Granulocytes: 0 %
Lymphocytes Relative: 16 %
Lymphs Abs: 1.3 10*3/uL (ref 0.7–4.0)
MCH: 33.5 pg (ref 26.0–34.0)
MCHC: 34.4 g/dL (ref 30.0–36.0)
MCV: 97.4 fL (ref 80.0–100.0)
Monocytes Absolute: 0.8 10*3/uL (ref 0.1–1.0)
Monocytes Relative: 10 %
Neutro Abs: 5.7 10*3/uL (ref 1.7–7.7)
Neutrophils Relative %: 73 %
Platelets: 187 10*3/uL (ref 150–400)
RBC: 3.91 MIL/uL (ref 3.87–5.11)
RDW: 14 % (ref 11.5–15.5)
WBC: 7.9 10*3/uL (ref 4.0–10.5)
nRBC: 0 % (ref 0.0–0.2)

## 2023-01-15 LAB — URINALYSIS, ROUTINE W REFLEX MICROSCOPIC
Bilirubin Urine: NEGATIVE
Glucose, UA: NEGATIVE mg/dL
Hgb urine dipstick: NEGATIVE
Ketones, ur: 5 mg/dL — AB
Leukocytes,Ua: NEGATIVE
Nitrite: NEGATIVE
Protein, ur: NEGATIVE mg/dL
Specific Gravity, Urine: 1.012 (ref 1.005–1.030)
pH: 7 (ref 5.0–8.0)

## 2023-01-15 LAB — COMPREHENSIVE METABOLIC PANEL
ALT: 17 U/L (ref 0–44)
AST: 26 U/L (ref 15–41)
Albumin: 3.6 g/dL (ref 3.5–5.0)
Alkaline Phosphatase: 74 U/L (ref 38–126)
Anion gap: 14 (ref 5–15)
BUN: 24 mg/dL — ABNORMAL HIGH (ref 8–23)
CO2: 24 mmol/L (ref 22–32)
Calcium: 10.5 mg/dL — ABNORMAL HIGH (ref 8.9–10.3)
Chloride: 98 mmol/L (ref 98–111)
Creatinine, Ser: 0.97 mg/dL (ref 0.44–1.00)
GFR, Estimated: 57 mL/min — ABNORMAL LOW (ref >60)
Glucose, Bld: 91 mg/dL (ref 70–99)
Potassium: 3.8 mmol/L (ref 3.5–5.1)
Sodium: 136 mmol/L (ref 135–145)
Total Bilirubin: 0.5 mg/dL (ref 0.3–1.2)
Total Protein: 6.8 g/dL (ref 6.5–8.1)

## 2023-01-15 LAB — TROPONIN I (HIGH SENSITIVITY)
Troponin I (High Sensitivity): 18 ng/L — ABNORMAL HIGH (ref <18)
Troponin I (High Sensitivity): 21 ng/L — ABNORMAL HIGH (ref <18)

## 2023-01-15 LAB — TSH: TSH: 3.172 u[IU]/mL (ref 0.350–4.500)

## 2023-01-15 MED ORDER — IOHEXOL 350 MG/ML SOLN
70.0000 mL | Freq: Once | INTRAVENOUS | Status: AC | PRN
  Administered 2023-01-15: 70 mL via INTRAVENOUS

## 2023-01-15 NOTE — ED Notes (Signed)
RN went to collect labs. Patient gone to CT.

## 2023-01-15 NOTE — ED Provider Notes (Signed)
  Provider Note MRN:  [id number]  Arrival date & time: 01/15/23    ED Course and Medical Decision Making  Assumed care from [facility] at shift change.  See note from prior team for complete details, in brief:  Clinical Course as of 01/15/23 2202  Thu Jan 15, 2023  1517 89 yo female, intermittent confusion since fall, woke up this am with CP radiation to back. ?pleuritic. Recent fall. Recent UTI. HDS. On BB. CTH/CTPE/UA/trop pending.  [SG]    Clinical Course User Index [SG] Jeanell Sparrow, DO    Plan per prior physician f/u cardiology  Spoke w/ Dr Shellia Carwin with cardiology, recommend o/p stress, f/u in the office On recheck she is asymptomatic, labs are stable, CTA is stable, CTH is stable. No evidence of UTI She had a fall recently with some intermittent confusion, ?concussion, neuro exam is non-focal, concussion precautions for family F/u with cardiology  Cp today favored to be atypical, confusion favored 2/2 possible concussion Obs offered, family prefers to go home and f/u in office She is hds  The patient improved significantly and was discharged in stable condition. Detailed discussions were had with the patient regarding current findings, and need for close f/u with PCP or on call doctor. The patient has been instructed to return immediately if the symptoms worsen in any way for re-evaluation. Patient verbalized understanding and is in agreement with current care plan. All questions answered prior to discharge.    Procedures  Final Clinical Impressions(s) / ED Diagnoses     ICD-10-CM   1. Elevated troponin  R79.89     2. Atypical chest pain  R07.89       ED Discharge Orders     None         Discharge Instructions      Please follow up with your cardiologist         Jeanell Sparrow, DO 01/15/23 2202

## 2023-01-15 NOTE — ED Triage Notes (Signed)
Pt BIB EMS from home for cx pain that started around 0900. Planning on going to see cardiologist but pain worsened. C/o pressure in center of chest and back pain. Denies weakness, N/V, SOB. Pain worsens with movement. Aox4.

## 2023-01-15 NOTE — Discharge Instructions (Addendum)
Please follow-up with your cardiologist.

## 2023-01-15 NOTE — ED Notes (Signed)
Patient's husband came to ask about labs. RN informed husband she will get labs shortly

## 2023-01-15 NOTE — Consult Note (Signed)
CARDIOLOGY CONSULT NOTE  Patient ID: [name] MRN: [id number] DOB/AGE: [birth year] 89 y.o.  Admit date: 01/15/2023 Referring Physician  Dr Rogene Houston Primary Physician:  Victoria Pretty, MD Reason for Consultation  chest pain  Patient ID: [name], female    DOB: 1933/04/12, 89 y.o.   MRN: [id number]  Chief Complaint  Patient presents with   Chest Pain   HPI:    [name]  is a 89 y.o. female with hypertension, hypothyroidism, remote history of colon and breast cancer status post partial colectomy, left mastectomy, chemotherapy, SVT, mod AI, TR. She presented to the ED today with complaints of chest pain. Chest pain is radiating to her back and is located more so on the right side. Unclear if associated with activity but it does become worse with movement. Given her history of cancer, she could have metastasis causing her chest and back pain. There is no shortness of breath associated with the chest pain. Patient is feeling better and she thinks it is related to muscle or bone pain. She would really like to go home and follow-up in office with [place] next week. She denies exertional chest pain, shortness of breath, palpitations, diaphoresis, syncope.   Past Medical History:  Diagnosis Date   Allergy    Arthritis    Asthma    Blood transfusion 2004   Breast cancer ([place])    1984.  right mastectomy   Colon cancer ([place])    2004   COPD (chronic obstructive pulmonary disease) ([place])    Dysrhythmia    racing heartbeats at times, takes metoprolol   Hypertension    Hypothyroidism    Osteopenia    Past Surgical History:  Procedure Laterality Date   COLON RESECTION  2004   COLON SURGERY     rt hemicolectomy for Ca   HYSTERECTOMY ABDOMINAL WITH SALPINGO-OOPHORECTOMY     MASTECTOMY Right    rt   NASAL POLYP EXCISION     OPEN REDUCTION INTERNAL FIXATION (ORIF) DISTAL PHALANX Right 08/21/2016   Procedure: OPEN REDUCTION INTERNAL FIXATION (ORIF) RIGHT RADIUS DISTAL PHALANX;   Surgeon: Leanora Cover, MD;  Location: [place];  Service: Orthopedics;  Laterality: Right;   VESICOVAGINAL FISTULA CLOSURE W/ TAH     Social History   Tobacco Use   Smoking status: Former    Packs/day: 0.25    Years: 1.00    Total pack years: 0.25    Types: Cigarettes    Quit date: 12/08/1982    Years since quitting: 40.1   Smokeless tobacco: Never   Tobacco comments:    smoked "on and off socially"  Substance Use Topics   Alcohol use: Yes    Alcohol/week: 7.0 standard drinks of alcohol    Types: 7 Standard drinks or equivalent per week    Comment: wine 1 per day    Family History  Problem Relation Age of Onset   Allergies Mother    Asthma Mother    Kidney cancer Mother        mets   Heart attack Mother    Liver cancer Mother    Heart attack Father    Colon cancer Neg Hx    Stomach cancer Neg Hx     Marital Status: Married  ROS  Review of Systems  Cardiovascular:  Positive for chest pain.  Musculoskeletal:  Positive for back pain.   Objective      01/15/2023   12:12 PM 01/15/2023   12:03 PM 08/06/2022  9:41 AM  Vitals with BMI  Height  5' 1"$  5' 1"$   Weight  98 lbs 99 lbs  BMI  123456 [id number]  Systolic [id number]  123456  Diastolic 59  68  Pulse 57  61    Blood pressure (!) 142/59, pulse (!) 57, temperature 98.4 F (36.9 C), temperature source Oral, resp. rate 17, height 5' 1"$  (1.549 m), weight 44.5 kg, SpO2 99 %.    Physical Exam Vitals reviewed.  HENT:     Head: Normocephalic and atraumatic.  Cardiovascular:     Rate and Rhythm: Normal rate and regular rhythm.     Pulses: Normal pulses.     Heart sounds: Murmur heard.  Pulmonary:     Effort: Pulmonary effort is normal.     Breath sounds: Normal breath sounds.  Abdominal:     General: Bowel sounds are normal.  Musculoskeletal:     Right lower leg: No edema.     Left lower leg: No edema.  Skin:    General: Skin is warm and dry.  Neurological:     Mental Status: She is alert.    Laboratory  examination:   Recent Labs    01/15/23 1243  NA 136  K 3.8  CL 98  CO2 24  GLUCOSE 91  BUN 24*  CREATININE 0.97  CALCIUM 10.5*  GFRNONAA 57*   estimated creatinine clearance is 28.7 mL/min (by C-G formula based on SCr of 0.97 mg/dL).     Latest Ref Rng & Units 01/15/2023   12:43 PM 08/19/2016   11:25 AM  CMP  Glucose 70 - 99 mg/dL 91  85   BUN 8 - 23 mg/dL 24  20   Creatinine 0.44 - 1.00 mg/dL 0.97  1.06   Sodium 135 - 145 mmol/L 136  133   Potassium 3.5 - 5.1 mmol/L 3.8  4.9   Chloride 98 - 111 mmol/L 98  98   CO2 22 - 32 mmol/L 24  27   Calcium 8.9 - 10.3 mg/dL 10.5  9.6   Total Protein 6.5 - 8.1 g/dL 6.8    Total Bilirubin 0.3 - 1.2 mg/dL 0.5    Alkaline Phos 38 - 126 U/L 74    AST 15 - 41 U/L 26    ALT 0 - 44 U/L 17        Latest Ref Rng & Units 01/15/2023   12:43 PM  CBC  WBC 4.0 - 10.5 K/uL 7.9   Hemoglobin 12.0 - 15.0 g/dL 13.1   Hematocrit 36.0 - 46.0 % 38.1   Platelets 150 - 400 K/uL 187    Lipid Panel No results for input(s): "CHOL", "TRIG", "[place]", "VLDL", "HDL", "CHOLHDL", "LDLDIRECT" in the last 8760 hours.  HEMOGLOBIN A1C No results found for: "HGBA1C", "MPG" TSH Recent Labs    01/15/23 1243  TSH 3.172   BNP (last 3 results) No results for input(s): "BNP" in the last 8760 hours. Cardiac Panel (last 3 results) Recent Labs    01/15/23 1243  TROPONINIHS 18*     Medications and allergies  No Active Allergies   No outpatient medications have been marked as taking for the 01/15/23 encounter [facility] Encounter).    Scheduled Meds: Continuous Infusions: PRN Meds:.   No intake/output data recorded. No intake/output data recorded.  Net IO Since Admission: No IO data has been entered for this period [01/15/23 1546]   Radiology:   Imaging results have been reviewed and DG Chest [facility] 1 View  Result Date:  01/15/2023 CLINICAL DATA:  Chest pain EXAM: PORTABLE CHEST 1 VIEW COMPARISON:  06/06/2011 FINDINGS: Cardiomegaly. Aortic  atherosclerosis. Hyperinflated lungs with coarsened interstitial markings bilaterally. Probable skin fold at the periphery of the left upper lung field. Blunting of the left costophrenic angle, likely related to scarring or atelectasis. IMPRESSION: 1. Probable skin fold at the periphery of the left upper lung field. Recommend repeat frontal chest radiograph to exclude the possibility of a small pneumothorax. 2. Cardiomegaly. 3. Hyperinflated lungs with coarsened interstitial markings bilaterally, suggesting COPD. Electronically Signed   By: Davina Poke D.O.   On: 01/15/2023 12:41    Cardiac Studies:   Mobile cardiac telemetry 13 days 06/27/2022 - 07/11/2022: Dominant rhythm: Sinus. HR 41-89 bpm. Avg HR 63 bpm, in sinus rhythm. >2000 episodes of SVT/atrial tachycardia, fastest at 187 bpm for 3 min 13 sec, longest for 7 min 13 sec at 138 bpm. 14.4% isolated SVE, 4.7% couplets, 3% triplets. 0 episodes of VT. <1% isolated VE, couplets. No convincing evidence of atrial fibrillation. Some episodes marked as AF are likely atrial tachycardia with variable AV conduction  No VT/high grade AV block, sinus pause >3sec noted. 0 patient triggered events.    EKG 06/27/2022: Sinus rhythm 75 bpm Run of atrial tachycardia  Old anteroseptal infarct Negative T-waves  -Possible  Anterolateral  ischemia   Echocardiogram 06/16/2022:  Left ventricle cavity is normal in size and wall thickness. Normal global  wall motion. Normal LV systolic function with EF 59%. Indeterminate  diastolic filling pattern. Calculated EF 59%.  Left atrial cavity is severely dilated.  Structurally normal trileaflet aortic valve.  Moderate (Grade II) aortic  regurgitation.  Mild to moderate mitral regurgitation.  Moderate tricuspid regurgitation. Estimated pulmonary artery systolic  pressure 32 mmHg.  Mild pulmonic regurgitation.  Previous study on 01/08/2021 showed moderate left atrial dilatation. No  other significant change  noted.    Mobile cardiac telemetry 13 days 02/04/2021 - 02/18/2021: Dominant rhythm: Sinus. HR 42-91 bpm. Avg HR 62 bpm. 95 episodes of SVT-probable atrial tachycardia, fastest at 174 bpm for 6 min 13 sec, longest for 16 min 29 sec at 151 bpm. 6.1%% isolated SVE, <1 couplet/triplets. <1% isolated VE, no couplet/triplets. No atrial fibrillation/atrial flutter/VT/high grade AV block, sinus pause >3sec noted. 2 patient triggered events, correlated with SVE, SVT     Lexiscan Tetrofosmin Stress Test  02/04/2021: Nondiagnostic ECG stress. Myocardial perfusion is normal. Overall LV systolic function is normal without regional wall motion abnormalities. Stress LV EF: 74%.  No previous exam available for comparison. Low risk.    EKG:  01/15/2023: Sinus rhythm. LVH with ST-T wave changes secondary to hypertrophy. Anterior infarct, old. Prolonged QT interval. Relatively no change compared to 06/2022 other than atrial tachycardia is now sinus rhythm.  Assessment & Recommendations:   Chest pain, atypical in nature Will obtain nuclear stress test and repeat echo in office Patient has follow-up with Dr Virgina Jock next week. Maintain on telemetry   Mild troponin leak  Will continue to trend Appears to be demand ischemia   Paroxysmal atrial tachycardia:  No anticoagulation recommended.  Continue diltiazem and metoprolol. [place] with discharge and close follow-up in office.     Floydene Flock, DO, [facility] 01/15/2023, 3:46 PM Office: [phone]

## 2023-01-15 NOTE — ED Provider Notes (Addendum)
[facility] Provider Note   CSN: [id number] Arrival date & time: 01/15/23  1159     History  Chief Complaint  Patient presents with   Chest Pain    [name] is a 89 y.o. female.  Patient brought in by EMS from home home with a complaint of chest pain patient states she does not know when it started.  EMS reported 9 in the morning.  Patient states she just has pain in her right posterior back area thoracic area before EMS stated that it was pressure in the center of the chest and back pain.  Patient denied any center chest pain.  Patient also denies any weakness nausea vomiting shortness of breath.  Patient does not state whether the pain gets better or worse.  But they wrote that it worsens with movement.  Michela Pitcher it was alert and oriented x 4.  Patient seems to have some degree of confusion.  Chart review shows that patient is followed by [facility] cardiology for a history of PAT.  Treated with Dilt.  Not on any blood thinners according to their notes from the fall.  And patient has a history of hypothyroidism and is on Synthroid.       Home Medications Prior to Admission medications   Medication Sig Start Date End Date Taking? Authorizing Provider  acetaminophen (TYLENOL) 325 MG tablet Take 2 tablets (650 mg total) by mouth every 6 (six) hours as needed for moderate pain. 07/13/22   Jaynee Eagles, PA-C  ADVAIR DISKUS 100-50 MCG/DOSE AEPB USE 1 PUFF BY MOUTH EVERY12 HOURS. Patient taking differently: Inhale 1 puff into the lungs every 12 (twelve) hours. 07/17/14   Tanda Rockers, MD  albuterol (PROAIR HFA) 108 (90 BASE) MCG/ACT inhaler Inhale 2 puffs into the lungs every 6 (six) hours as needed for wheezing. Needs to schedule an appt for refills. Patient taking differently: Inhale 2 puffs into the lungs every 6 (six) hours as needed for wheezing. 09/03/12 08/06/22  Tanda Rockers, MD  cyanocobalamin 1000 MCG tablet Take 1,000 mcg by mouth  daily.    [provider]  diltiazem (TIAZAC) 180 MG 24 hr capsule TAKE ONE CAPSULE BY MOUTH ONE TIME DAILY 12/05/22   Patwardhan, Reynold Bowen, MD  fluticasone (FLONASE) 50 MCG/ACT nasal spray as needed. 05/27/11   [provider]  levothyroxine (SYNTHROID) 112 MCG tablet Take 112 mcg by mouth daily. 06/19/21   [provider]  losartan-hydrochlorothiazide (HYZAAR) 100-25 MG per tablet Take 1 tablet by mouth daily.    [provider]  metoprolol succinate (TOPROL-XL) 25 MG 24 hr tablet TAKE ONE TABLET BY MOUTH ONE TIME DAILY 12/05/22   Patwardhan, Reynold Bowen, MD  Vitamin D, Cholecalciferol, 1000 units TABS Take 1,000 Units by mouth daily.     [provider]      Allergies    Patient has no active allergies.    Review of Systems   Review of Systems  Constitutional:  Negative for chills and fever.  HENT:  Negative for ear pain and sore throat.   Eyes:  Negative for pain and visual disturbance.  Respiratory:  Negative for cough and shortness of breath.   Cardiovascular:  Positive for chest pain. Negative for palpitations.  Gastrointestinal:  Negative for abdominal pain and vomiting.  Genitourinary:  Negative for dysuria and hematuria.  Musculoskeletal:  Positive for back pain. Negative for arthralgias.  Skin:  Negative for color change and rash.  Neurological:  Negative for seizures and syncope.  All other systems reviewed and are negative.   Physical Exam Updated Vital Signs BP (!) 142/59 (BP Location: Right Arm)   Pulse (!) 57   Temp 98.4 F (36.9 C) (Oral)   Resp 17   Ht 1.549 m ('5\' 1"'$ )   Wt 44.5 kg   SpO2 99%   BMI 18.52 kg/m  Physical Exam Vitals and nursing note reviewed.  Constitutional:      General: She is not in acute distress.    Appearance: Normal appearance. She is well-developed.  HENT:     Head: Normocephalic and atraumatic.  Eyes:     Extraocular Movements: Extraocular movements intact.     Conjunctiva/sclera:  Conjunctivae normal.     Pupils: Pupils are equal, round, and reactive to light.  Cardiovascular:     Rate and Rhythm: Normal rate and regular rhythm.     Heart sounds: No murmur heard. Pulmonary:     Effort: Pulmonary effort is normal. No respiratory distress.     Breath sounds: Normal breath sounds. No wheezing, rhonchi or rales.     Comments: Chest nontender right posterior no evidence of any rash. Chest:     Chest wall: No tenderness.  Abdominal:     Palpations: Abdomen is soft.     Tenderness: There is no abdominal tenderness.  Musculoskeletal:        General: No swelling.     Cervical back: Neck supple.     Right lower leg: No edema.     Left lower leg: No edema.  Skin:    General: Skin is warm and dry.     Capillary Refill: Capillary refill takes less than 2 seconds.  Neurological:     General: No focal deficit present.     Mental Status: She is alert.  Psychiatric:        Mood and Affect: Mood normal.     ED Results / Procedures / Treatments   Labs (all labs ordered are listed, but only abnormal results are displayed) Labs Reviewed  CBC WITH DIFFERENTIAL/PLATELET  COMPREHENSIVE METABOLIC PANEL  TSH  TROPONIN I (HIGH SENSITIVITY)    EKG EKG Interpretation  Date/Time:  Thursday January 15 2023 12:04:29 EST Ventricular Rate:  61 PR Interval:  147 QRS Duration: 101 QT Interval:  521 QTC Calculation: 525 R Axis:   77 Text Interpretation: Sinus rhythm LVH with secondary repolarization abnormality Anterior infarct, old Prolonged QT interval Confirmed by Fredia Sorrow [phone]) on 01/15/2023 12:10:43 PM  Radiology No results found.  Procedures Procedures    Medications Ordered in ED Medications - No data to display  ED Course/ Medical Decision Making/ A&P                             Medical Decision Making Amount and/or Complexity of Data Reviewed Labs: ordered. Radiology: ordered.     Will initiate cardiac workup to include chest x-ray.   Patient's oxygen saturation very good at 99%.  Blood pressure 142/59 heart rate reported at 58 EKG has heart rate is 61 secondary repolarization abnormality anterior infarct old and prolonged QT.  Patient will definitely be getting troponins.  Based on her story it seems as if things could be more musculoskeletal.  But I am not so sure patient's history is accurate.  Hopefully her husband will arrive to help clarify.  No leg swelling.  Currently not necessarily worried about PE but anytime you have  like a pleuritic type chest pain or is a possibility.  Oxygen sats are excellent we will keep this in mind.  Patient's husband here raising some additional concerns.  Patient had a fall a few weeks ago and has an outpatient head CT ordered based on some concerns whether there was some confusion.  Also she recently completed a course of antibiotics for urinary tract infection so recommending that we check a UA.  Patient's initial troponin 18.  So delta troponins will be important.  CBC normal complete metabolic panel GFR 57 electrolytes normal.  TSH is normal at 3.17.  Chest x-ray probable skinfold periphery left upper lung field recommend repeat frontal chest radiograph to exclude the possibility of a small pneumothorax.  Cardiomegaly hyperinflated lungs and coarsened interstitial markings suggesting COPD.  Based on this and the somewhat pleuritic type chest pain although not hypoxic we will go ahead and do CT angio.  Had added on a D-dimer but we will go ahead and cancel that.  Will have evening physician follow-up on the delta troponin and the CT angio head CT and the urinalysis.   Final Clinical Impression(s) / ED Diagnoses Final diagnoses:  Atypical chest pain    Rx / DC Orders ED Discharge Orders     None         Fredia Sorrow, MD 01/15/23 1228    Fredia Sorrow, MD 01/15/23 [phone]

## 2023-01-18 ENCOUNTER — Encounter (HOSPITAL_BASED_OUTPATIENT_CLINIC_OR_DEPARTMENT_OTHER): Payer: Self-pay

## 2023-01-18 ENCOUNTER — Ambulatory Visit (HOSPITAL_BASED_OUTPATIENT_CLINIC_OR_DEPARTMENT_OTHER): Payer: Medicare Other

## 2023-01-19 ENCOUNTER — Telehealth: Payer: Self-pay

## 2023-01-19 ENCOUNTER — Ambulatory Visit (HOSPITAL_BASED_OUTPATIENT_CLINIC_OR_DEPARTMENT_OTHER): Payer: Medicare Other

## 2023-01-19 NOTE — Telephone Encounter (Signed)
Location of hospitalization: [facility] Reason for hospitalization: chest pains and back pains. Date of discharge: 01/15/2023 Date of first communication with patient: today Person contacting patient: Me Current symptoms: No Do you understand why you were in the Hospital: Yes Questions regarding discharge instructions: None Where were you discharged to: Home Medications reviewed: Yes Allergies reviewed: Yes Dietary changes reviewed: Yes. Discussed low fat and low salt diet.  Referals reviewed: NA Activities of Daily Living: Able to with mild limitations Any transportation issues/concerns: None Any patient concerns: None Confirmed importance & date/time of Follow up appt: Yes Confirmed with patient if condition begins to worsen call. Pt was given the office number and encouraged to call back with questions or concerns: Yes

## 2023-01-19 NOTE — Telephone Encounter (Signed)
Patient is still admitted in the hospital.

## 2023-01-20 DIAGNOSIS — N3281 Overactive bladder: Secondary | ICD-10-CM | POA: Diagnosis not present

## 2023-01-20 DIAGNOSIS — R636 Underweight: Secondary | ICD-10-CM | POA: Diagnosis not present

## 2023-01-20 DIAGNOSIS — Z09 Encounter for follow-up examination after completed treatment for conditions other than malignant neoplasm: Secondary | ICD-10-CM | POA: Diagnosis not present

## 2023-01-22 ENCOUNTER — Ambulatory Visit: Payer: Medicare Other | Admitting: Cardiology

## 2023-02-05 ENCOUNTER — Ambulatory Visit: Payer: Medicare Other | Admitting: Cardiology

## 2023-02-18 DIAGNOSIS — I700 Atherosclerosis of aorta: Secondary | ICD-10-CM | POA: Diagnosis not present

## 2023-02-18 DIAGNOSIS — I10 Essential (primary) hypertension: Secondary | ICD-10-CM | POA: Diagnosis not present

## 2023-02-18 DIAGNOSIS — J449 Chronic obstructive pulmonary disease, unspecified: Secondary | ICD-10-CM | POA: Diagnosis not present

## 2023-02-18 DIAGNOSIS — E559 Vitamin D deficiency, unspecified: Secondary | ICD-10-CM | POA: Diagnosis not present

## 2023-02-18 DIAGNOSIS — E039 Hypothyroidism, unspecified: Secondary | ICD-10-CM | POA: Diagnosis not present

## 2023-02-18 DIAGNOSIS — N3281 Overactive bladder: Secondary | ICD-10-CM | POA: Diagnosis not present

## 2023-02-18 DIAGNOSIS — Z0000 Encounter for general adult medical examination without abnormal findings: Secondary | ICD-10-CM | POA: Diagnosis not present

## 2023-02-18 DIAGNOSIS — I471 Supraventricular tachycardia, unspecified: Secondary | ICD-10-CM | POA: Diagnosis not present

## 2023-02-18 DIAGNOSIS — N1831 Chronic kidney disease, stage 3a: Secondary | ICD-10-CM | POA: Diagnosis not present

## 2023-02-18 DIAGNOSIS — J432 Centrilobular emphysema: Secondary | ICD-10-CM | POA: Diagnosis not present

## 2023-02-18 DIAGNOSIS — R413 Other amnesia: Secondary | ICD-10-CM | POA: Diagnosis not present

## 2023-02-19 ENCOUNTER — Encounter: Payer: Self-pay | Admitting: Physician Assistant

## 2023-03-06 ENCOUNTER — Other Ambulatory Visit: Payer: Self-pay | Admitting: Cardiology

## 2023-03-06 DIAGNOSIS — I471 Supraventricular tachycardia, unspecified: Secondary | ICD-10-CM

## 2023-03-12 DIAGNOSIS — J4489 Other specified chronic obstructive pulmonary disease: Secondary | ICD-10-CM | POA: Diagnosis not present

## 2023-03-12 DIAGNOSIS — Z85038 Personal history of other malignant neoplasm of large intestine: Secondary | ICD-10-CM | POA: Diagnosis not present

## 2023-03-12 DIAGNOSIS — Z556 Problems related to health literacy: Secondary | ICD-10-CM | POA: Diagnosis not present

## 2023-03-12 DIAGNOSIS — Z853 Personal history of malignant neoplasm of breast: Secondary | ICD-10-CM | POA: Diagnosis not present

## 2023-03-12 DIAGNOSIS — I471 Supraventricular tachycardia, unspecified: Secondary | ICD-10-CM | POA: Diagnosis not present

## 2023-03-12 DIAGNOSIS — R413 Other amnesia: Secondary | ICD-10-CM | POA: Diagnosis not present

## 2023-03-12 DIAGNOSIS — E039 Hypothyroidism, unspecified: Secondary | ICD-10-CM | POA: Diagnosis not present

## 2023-03-12 DIAGNOSIS — Z7951 Long term (current) use of inhaled steroids: Secondary | ICD-10-CM | POA: Diagnosis not present

## 2023-03-12 DIAGNOSIS — Z9181 History of falling: Secondary | ICD-10-CM | POA: Diagnosis not present

## 2023-03-12 DIAGNOSIS — E673 Hypervitaminosis D: Secondary | ICD-10-CM | POA: Diagnosis not present

## 2023-03-12 DIAGNOSIS — R195 Other fecal abnormalities: Secondary | ICD-10-CM | POA: Diagnosis not present

## 2023-03-12 DIAGNOSIS — M8580 Other specified disorders of bone density and structure, unspecified site: Secondary | ICD-10-CM | POA: Diagnosis not present

## 2023-03-12 DIAGNOSIS — I129 Hypertensive chronic kidney disease with stage 1 through stage 4 chronic kidney disease, or unspecified chronic kidney disease: Secondary | ICD-10-CM | POA: Diagnosis not present

## 2023-03-12 DIAGNOSIS — N183 Chronic kidney disease, stage 3 unspecified: Secondary | ICD-10-CM | POA: Diagnosis not present

## 2023-03-12 DIAGNOSIS — N390 Urinary tract infection, site not specified: Secondary | ICD-10-CM | POA: Diagnosis not present

## 2023-03-17 DIAGNOSIS — E039 Hypothyroidism, unspecified: Secondary | ICD-10-CM | POA: Diagnosis not present

## 2023-03-17 DIAGNOSIS — J4489 Other specified chronic obstructive pulmonary disease: Secondary | ICD-10-CM | POA: Diagnosis not present

## 2023-03-17 DIAGNOSIS — R413 Other amnesia: Secondary | ICD-10-CM | POA: Diagnosis not present

## 2023-03-17 DIAGNOSIS — Z85038 Personal history of other malignant neoplasm of large intestine: Secondary | ICD-10-CM | POA: Diagnosis not present

## 2023-03-17 DIAGNOSIS — Z7951 Long term (current) use of inhaled steroids: Secondary | ICD-10-CM | POA: Diagnosis not present

## 2023-03-17 DIAGNOSIS — E673 Hypervitaminosis D: Secondary | ICD-10-CM | POA: Diagnosis not present

## 2023-03-17 DIAGNOSIS — R195 Other fecal abnormalities: Secondary | ICD-10-CM | POA: Diagnosis not present

## 2023-03-17 DIAGNOSIS — I471 Supraventricular tachycardia, unspecified: Secondary | ICD-10-CM | POA: Diagnosis not present

## 2023-03-17 DIAGNOSIS — Z9181 History of falling: Secondary | ICD-10-CM | POA: Diagnosis not present

## 2023-03-17 DIAGNOSIS — I129 Hypertensive chronic kidney disease with stage 1 through stage 4 chronic kidney disease, or unspecified chronic kidney disease: Secondary | ICD-10-CM | POA: Diagnosis not present

## 2023-03-17 DIAGNOSIS — N390 Urinary tract infection, site not specified: Secondary | ICD-10-CM | POA: Diagnosis not present

## 2023-03-17 DIAGNOSIS — Z853 Personal history of malignant neoplasm of breast: Secondary | ICD-10-CM | POA: Diagnosis not present

## 2023-03-17 DIAGNOSIS — Z556 Problems related to health literacy: Secondary | ICD-10-CM | POA: Diagnosis not present

## 2023-03-17 DIAGNOSIS — M8580 Other specified disorders of bone density and structure, unspecified site: Secondary | ICD-10-CM | POA: Diagnosis not present

## 2023-03-17 DIAGNOSIS — N183 Chronic kidney disease, stage 3 unspecified: Secondary | ICD-10-CM | POA: Diagnosis not present

## 2023-03-19 DIAGNOSIS — R195 Other fecal abnormalities: Secondary | ICD-10-CM | POA: Diagnosis not present

## 2023-03-19 DIAGNOSIS — J4489 Other specified chronic obstructive pulmonary disease: Secondary | ICD-10-CM | POA: Diagnosis not present

## 2023-03-19 DIAGNOSIS — R413 Other amnesia: Secondary | ICD-10-CM | POA: Diagnosis not present

## 2023-03-19 DIAGNOSIS — Z85038 Personal history of other malignant neoplasm of large intestine: Secondary | ICD-10-CM | POA: Diagnosis not present

## 2023-03-19 DIAGNOSIS — E039 Hypothyroidism, unspecified: Secondary | ICD-10-CM | POA: Diagnosis not present

## 2023-03-19 DIAGNOSIS — E673 Hypervitaminosis D: Secondary | ICD-10-CM | POA: Diagnosis not present

## 2023-03-19 DIAGNOSIS — Z7951 Long term (current) use of inhaled steroids: Secondary | ICD-10-CM | POA: Diagnosis not present

## 2023-03-19 DIAGNOSIS — M8580 Other specified disorders of bone density and structure, unspecified site: Secondary | ICD-10-CM | POA: Diagnosis not present

## 2023-03-19 DIAGNOSIS — Z9181 History of falling: Secondary | ICD-10-CM | POA: Diagnosis not present

## 2023-03-19 DIAGNOSIS — N390 Urinary tract infection, site not specified: Secondary | ICD-10-CM | POA: Diagnosis not present

## 2023-03-19 DIAGNOSIS — I471 Supraventricular tachycardia, unspecified: Secondary | ICD-10-CM | POA: Diagnosis not present

## 2023-03-19 DIAGNOSIS — I129 Hypertensive chronic kidney disease with stage 1 through stage 4 chronic kidney disease, or unspecified chronic kidney disease: Secondary | ICD-10-CM | POA: Diagnosis not present

## 2023-03-19 DIAGNOSIS — N183 Chronic kidney disease, stage 3 unspecified: Secondary | ICD-10-CM | POA: Diagnosis not present

## 2023-03-19 DIAGNOSIS — Z556 Problems related to health literacy: Secondary | ICD-10-CM | POA: Diagnosis not present

## 2023-03-19 DIAGNOSIS — Z853 Personal history of malignant neoplasm of breast: Secondary | ICD-10-CM | POA: Diagnosis not present

## 2023-03-20 DIAGNOSIS — N390 Urinary tract infection, site not specified: Secondary | ICD-10-CM | POA: Diagnosis not present

## 2023-03-20 DIAGNOSIS — I129 Hypertensive chronic kidney disease with stage 1 through stage 4 chronic kidney disease, or unspecified chronic kidney disease: Secondary | ICD-10-CM | POA: Diagnosis not present

## 2023-03-20 DIAGNOSIS — E673 Hypervitaminosis D: Secondary | ICD-10-CM | POA: Diagnosis not present

## 2023-03-20 DIAGNOSIS — N183 Chronic kidney disease, stage 3 unspecified: Secondary | ICD-10-CM | POA: Diagnosis not present

## 2023-03-23 ENCOUNTER — Encounter: Payer: Self-pay | Admitting: Physician Assistant

## 2023-03-23 ENCOUNTER — Ambulatory Visit: Payer: Medicare Other | Admitting: Physician Assistant

## 2023-03-23 ENCOUNTER — Ambulatory Visit: Payer: Medicare Other

## 2023-03-23 VITALS — BP 117/56 | HR 62 | Resp 18 | Ht 61.0 in | Wt 104.0 lb

## 2023-03-23 DIAGNOSIS — G301 Alzheimer's disease with late onset: Secondary | ICD-10-CM | POA: Diagnosis not present

## 2023-03-23 DIAGNOSIS — F02B0 Dementia in other diseases classified elsewhere, moderate, without behavioral disturbance, psychotic disturbance, mood disturbance, and anxiety: Secondary | ICD-10-CM | POA: Diagnosis not present

## 2023-03-23 NOTE — Progress Notes (Signed)
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  [name] is a very pleasant 89 y.o. year old RH female with a history of hypertension, hyperlipidemia, QT I prolongation, seen today for evaluation of memory loss. MoCA today is 7. Most recent CT of the head from 01/2023 after a mechanical fall shows  diffuse cerebral atrophy and ex vacuo dilatation of the lateral ventricles. Findings are suspicious for dementia likely due to Alzheimer's disease. Patient does not show any behavioral disturbance. She is still able to participate in some ADLs. She no longer drives. We discussed  antidementia meds , although the risks and side effects of the med would outweigh the benefits. Husband agreed not to initiate any antidementia meds     Folllow up as needed   No antidementia meds are indicated. Recommend Adult Day Programs. Information provided Continue to control mood as per PCP Recommend good control of cardiovascular risk factors.     Subjective:    The patient is accompanied by her husband  who supplements the history.    How long did patient have memory difficulties? May have been present for several years, but over the  last 6 months, she forgets recent conversations "but is good with people's names". She still likes to do puzzles and play music.  repeats oneself?  Endorsed Disoriented when walking into a room?  Patient denies  Leaving objects in unusual places?  Endorsed, especially in the kitchen  Wandering behavior? denies   Any personality changes since last visit? May be irritable at times but not frequently  Any history of depression?:  denies   Hallucinations or paranoia?  denies   Seizures? denies    Any sleep changes?  Sleeps well. Denies  vivid dreams, REM behavior or sleepwalking   Sleep apnea?  denies   Any hygiene concerns?  denies   Independent of bathing and dressing?  Endorsed  Does the patient need help with medications? Husband is in charge   Who is in charge of the finances? Husband  is in charge     Any changes in appetite? " for a while it was decreased, but then improved"     Patient have trouble swallowing?  denies   Does the patient cook? "Some"  Any kitchen accidents such as leaving the stove on? Sometimes she forgets to shut it off  Any headaches?  denies   Chronic back pain?  denies   Ambulates with difficulty? denies   Recent falls or head injuries?In 01/2023 she had a  R shoulder fracture after a fall (mechanical ) . No LOC or head injury Vision changes? Recent B cataract surgery  Unilateral weakness, numbness or tingling?  denies   Any tremors?  denies   Any anosmia?  denies   Any incontinence of urine? Denies.  Any bowel dysfunction?    denies      Patient lives  with husband   History of heavy alcohol intake? denies   History of heavy tobacco use? denies   Family history of dementia? Aunt had AD   Does patient drive? No longer drives   No Known Allergies  Current Outpatient Medications  Medication Instructions   acetaminophen (TYLENOL) 650 mg, Oral, Every 6 hours PRN   ADVAIR DISKUS 100-50 MCG/DOSE AEPB USE 1 PUFF BY MOUTH EVERY12 HOURS.  albuterol (PROAIR HFA) 108 (90 BASE) MCG/ACT inhaler 2 puffs, Inhalation, Every 6 hours PRN, Needs to schedule an appt for refills.   cyanocobalamin 1,000 mcg, Oral, Daily   diltiazem (TIAZAC) 180 MG 24 hr capsule TAKE ONE CAPSULE BY MOUTH ONE TIME DAILY   fluticasone (FLONASE) 50 MCG/ACT nasal spray As needed   levothyroxine (SYNTHROID) 112 mcg, Oral, Daily   losartan-hydrochlorothiazide (HYZAAR) 100-25 MG per tablet 1 tablet, Oral, Daily,     metoprolol succinate (TOPROL-XL) 25 mg, Oral, Daily   Myrbetriq 25 mg, Oral, Daily   Vitamin D (Cholecalciferol) 1,000 Units, Oral, Daily     VITALS:   Vitals:    03/23/23 1323  BP: (!) 117/56  Pulse: 62  Resp: 18  SpO2: 98%  Weight: 104 lb (47.2 kg)  Height: 5\' 1"  (1.549 m)       No data to display          PHYSICAL EXAM   HEENT:  Normocephalic, atraumatic. The mucous membranes are moist. The superficial temporal arteries are without ropiness or tenderness. Cardiovascular: Regular rate and rhythm. Lungs: Clear to auscultation bilaterally. Neck: There are no carotid bruits noted bilaterally.  NEUROLOGICAL:    03/23/2023    1:00 PM  Montreal Cognitive Assessment   Visuospatial/ Executive (0/5) 1  Naming (0/3) 2  Attention: Read list of digits (0/2) 2  Attention: Read list of letters (0/1) 0  Attention: Serial 7 subtraction starting at 100 (0/3) 0  Language: Repeat phrase (0/2) 0  Language : Fluency (0/1) 0  Abstraction (0/2) 0  Delayed Recall (0/5) 0  Orientation (0/6) 1  Total 6  Adjusted Score (based on education) 7        No data to display           Orientation:  Alert and oriented to person, not to date or place. No aphasia or dysarthria. Fund of knowledge is reduced. Recent and  remote memory impaired  Attention and concentration are reduced  Able to name objects and  unable to repeat phrases. Delayed recall  0/ 5 Cranial nerves: There is good facial symmetry. Extraocular muscles are intact and visual fields are full to confrontational testing. Speech is fluent and clear. no tongue deviation. Hearing is intact to conversational tone.  Tone: Tone is good throughout. Sensation: Sensation is intact to light touch and pinprick throughout. Vibration is intact at the bilateral big toe.There is no extinction with double simultaneous stimulation. There is no sensory dermatomal level identified. Coordination: The patient has no difficulty with RAM's or FNF bilaterally. Normal finger to nose  Motor: Strength is 5/5 in the bilateral upper and lower extremities. There is no pronator drift. There are no fasciculations noted. DTR's:  Deep tendon reflexes are 2/4 at the bilateral biceps, triceps, brachioradialis, patella and achilles.  Plantar responses are downgoing bilaterally. Gait and Station: The patient is able to ambulate without difficulty.The patient is able to ambulate in a tandem fashion, able to stand in the Romberg position.     Thank you for allowing [place] the opportunity to participate in the care of this nice patient. Please do not hesitate to contact us for any questions or concerns.   Total time spent on today's visit was 60 minutes dedicated to this patient today, preparing to see patient, examining the patient, ordering tests and/or medications and counseling the patient, documenting clinical information in the EHR or other health record, independently interpreting results and communicating results to the patient/family, discussing treatment and goals, answering  patient's questions and coordinating care.  Cc:  Merri Brunette, MD  Marlowe Kays 03/23/2023 2:46 PM

## 2023-03-23 NOTE — Patient Instructions (Signed)
It was a pleasure to see you today at our office.   Recommendations:  Follow up as needed For guidance regarding [facility] and if placement were needed at the facility, contact Sidney Ace, Social Worker tel: [phone]  For assessment of decision of mental capacity and competency:  Call Dr. Erick Blinks, geriatric psychiatrist at [phone] Counseling regarding caregiver distress, including caregiver depression, anxiety and issues regarding community resources, adult day care programs, adult living facilities, or memory care questions:  please contact your  Primary Doctor's Social Worker  Whom to call: Memory  decline, memory medications: Call our office [phone]  For psychiatric meds, mood meds: Please have your primary care physician manage these medications.  If you have any severe symptoms of a stroke, or other severe issues such as confusion,severe chills or fever, etc call 911 or go to the ER as you may need to be evaluated further      RECOMMENDATIONS FOR ALL PATIENTS WITH MEMORY PROBLEMS: 1. Continue to exercise (Recommend 30 minutes of walking everyday, or 3 hours every week) 2. Increase social interactions - continue going to [place] and enjoy social gatherings with friends and family 3. Eat healthy, avoid fried foods and eat more fruits and vegetables 4. Maintain adequate blood pressure, blood sugar, and blood cholesterol level. Reducing the risk of stroke and cardiovascular disease also helps promoting better memory. 5. Avoid stressful situations. Live a simple life and avoid aggravations. Organize your time and prepare for the next day in anticipation. 6. Sleep well, avoid any interruptions of sleep and avoid any distractions in the bedroom that may interfere with adequate sleep quality 7. Avoid sugar, avoid sweets as there is a strong link between excessive sugar intake, diabetes, and cognitive impairment We discussed the Mediterranean diet,  which has been shown to help patients reduce the risk of progressive memory disorders and reduces cardiovascular risk. This includes eating fish, eat fruits and green leafy vegetables, nuts like almonds and hazelnuts, walnuts, and also use olive oil. Avoid fast foods and fried foods as much as possible. Avoid sweets and sugar as sugar use has been linked to worsening of memory function.  There is always a concern of gradual progression of memory problems. If this is the case, then we may need to adjust level of care according to patient needs. Support, both to the patient and caregiver, should then be put into place.    The Alzheimer[employer] Association is here all day, every day for people facing Alzheimer's disease through our free 24/7 Helpline: 6516371105. The Helpline provides reliable information and support to all those who need assistance, such as individuals living with memory loss, Alzheimer's or other dementia, caregivers, health care professionals and the public.  Our highly trained and knowledgeable staff can help you with: Understanding memory loss, dementia and Alzheimer's  Medications and other treatment options  General information about aging and brain health  Skills to provide quality care and to find the best care from professionals  Legal, financial and living-arrangement decisions Our Helpline also features: Confidential care consultation provided by master's level clinicians who can help with decision-making support, crisis assistance and education on issues families face every day  Help in a caller's preferred language using our translation service that features more than 200 languages and dialects  Referrals to local community programs, services and ongoing support     FALL PRECAUTIONS: Be cautious when walking. Scan the area for obstacles that may increase the risk of trips and falls.  When getting up in the mornings, sit up at the edge of the bed for a few minutes before  getting out of bed. Consider elevating the bed at the head end to avoid drop of blood pressure when getting up. Walk always in a well-lit room (use night lights in the walls). Avoid area rugs or power cords from appliances in the middle of the walkways. Use a walker or a cane if necessary and consider physical therapy for balance exercise. Get your eyesight checked regularly.  FINANCIAL OVERSIGHT: Supervision, especially oversight when making financial decisions or transactions is also recommended.  HOME SAFETY: Consider the safety of the kitchen when operating appliances like stoves, microwave oven, and blender. Consider having supervision and share cooking responsibilities until no longer able to participate in those. Accidents with firearms and other hazards in the house should be identified and addressed as well.   ABILITY TO BE LEFT ALONE: If patient is unable to contact 911 operator, consider using LifeLine, or when the need is there, arrange for someone to stay with patients. Smoking is a fire hazard, consider supervision or cessation. Risk of wandering should be assessed by caregiver and if detected at any point, supervision and safe proof recommendations should be instituted.  MEDICATION SUPERVISION: Inability to self-administer medication needs to be constantly addressed. Implement a mechanism to ensure safe administration of the medications.   DRIVING: Regarding driving, in patients with progressive memory problems, driving will be impaired. We advise to have someone else do the driving if trouble finding directions or if minor accidents are reported. Independent driving assessment is available to determine safety of driving.   If you are interested in the driving assessment, you can contact the following:  [employer] in [place] [phone]  [facility] [phone]  [facility] [phone] or  [phone]      Mediterranean Diet A Mediterranean diet refers to food and lifestyle choices that are based on the traditions of countries located on the [employer]. This way of eating has been shown to help prevent certain conditions and improve outcomes for people who have chronic diseases, like kidney disease and heart disease. What are tips for following this plan? Lifestyle  Cook and eat meals together with your family, when possible. Drink enough fluid to keep your urine clear or pale yellow. Be physically active every day. This includes: Aerobic exercise like running or swimming. Leisure activities like gardening, walking, or housework. Get 7-8 hours of sleep each night. If recommended by your health care provider, drink red wine in moderation. This means 1 glass a day for nonpregnant women and 2 glasses a day for men. A glass of wine equals 5 oz (150 mL). Reading food labels  Check the serving size of packaged foods. For foods such as rice and pasta, the serving size refers to the amount of cooked product, not dry. Check the total fat in packaged foods. Avoid foods that have saturated fat or trans fats. Check the ingredients list for added sugars, such as corn syrup. Shopping  At the grocery store, buy most of your food from the areas near the walls of the store. This includes: Fresh fruits and vegetables (produce). Grains, beans, nuts, and seeds. Some of these may be available in unpackaged forms or large amounts (in bulk). Fresh seafood. Poultry and eggs. Low-fat dairy products. Buy whole ingredients instead of prepackaged foods. Buy fresh fruits and vegetables in-season from local farmers markets. Buy frozen fruits  and vegetables in resealable bags. If you do not have access to quality fresh seafood, buy precooked frozen shrimp or canned fish, such as tuna, salmon, or sardines. Buy small amounts of raw or cooked vegetables, salads, or olives from the deli or salad  bar at your store. Stock your pantry so you always have certain foods on hand, such as olive oil, canned tuna, canned tomatoes, rice, pasta, and beans. Cooking  Cook foods with extra-virgin olive oil instead of using butter or other vegetable oils. Have meat as a side dish, and have vegetables or grains as your main dish. This means having meat in small portions or adding small amounts of meat to foods like pasta or stew. Use beans or vegetables instead of meat in common dishes like chili or lasagna. Experiment with different cooking methods. Try roasting or broiling vegetables instead of steaming or sauteing them. Add frozen vegetables to soups, stews, pasta, or rice. Add nuts or seeds for added healthy fat at each meal. You can add these to yogurt, salads, or vegetable dishes. Marinate fish or vegetables using olive oil, lemon juice, garlic, and fresh herbs. Meal planning  Plan to eat 1 vegetarian meal one day each week. Try to work up to 2 vegetarian meals, if possible. Eat seafood 2 or more times a week. Have healthy snacks readily available, such as: Vegetable sticks with hummus. Greek yogurt. Fruit and nut trail mix. Eat balanced meals throughout the week. This includes: Fruit: 2-3 servings a day Vegetables: 4-5 servings a day Low-fat dairy: 2 servings a day Fish, poultry, or lean meat: 1 serving a day Beans and legumes: 2 or more servings a week Nuts and seeds: 1-2 servings a day Whole grains: 6-8 servings a day Extra-virgin olive oil: 3-4 servings a day Limit red meat and sweets to only a few servings a month What are my food choices? Mediterranean diet Recommended Grains: Whole-grain pasta. Brown rice. Bulgar wheat. Polenta. Couscous. Whole-wheat bread. Orpah Cobb. Vegetables: Artichokes. Beets. Broccoli. Cabbage. Carrots. Eggplant. Green beans. Chard. Kale. Spinach. Onions. Leeks. Peas. Squash. Tomatoes. Peppers. Radishes. Fruits: Apples. Apricots. Avocado. Berries.  Bananas. Cherries. Dates. Figs. Grapes. Lemons. Melon. Oranges. Peaches. Plums. Pomegranate. Meats and other protein foods: Beans. Almonds. Sunflower seeds. Pine nuts. Peanuts. Cod. Salmon. Scallops. Shrimp. Tuna. Tilapia. Clams. Oysters. Eggs. Dairy: Low-fat milk. Cheese. Greek yogurt. Beverages: Water. Red wine. Herbal tea. Fats and oils: Extra virgin olive oil. Avocado oil. Grape seed oil. Sweets and desserts: [place] yogurt with honey. Baked apples. Poached pears. Trail mix. Seasoning and other foods: Basil. Cilantro. Coriander. Cumin. Mint. Parsley. Sage. Rosemary. Tarragon. Garlic. Oregano. Thyme. Pepper. Balsalmic vinegar. Tahini. Hummus. Tomato sauce. Olives. Mushrooms. Limit these Grains: Prepackaged pasta or rice dishes. Prepackaged cereal with added sugar. Vegetables: Deep fried potatoes (french fries). Fruits: Fruit canned in syrup. Meats and other protein foods: Beef. Pork. Lamb. Poultry with skin. Hot dogs. Tomasa Blase. Dairy: Ice cream. Sour cream. Whole milk. Beverages: Juice. Sugar-sweetened soft drinks. Beer. Liquor and spirits. Fats and oils: Butter. Canola oil. Vegetable oil. Beef fat (tallow). Lard. Sweets and desserts: Cookies. Cakes. Pies. Candy. Seasoning and other foods: Mayonnaise. Premade sauces and marinades. The items listed may not be a complete list. Talk with your dietitian about what dietary choices are right for you. Summary The Mediterranean diet includes both food and lifestyle choices. Eat a variety of fresh fruits and vegetables, beans, nuts, seeds, and whole grains. Limit the amount of red meat and sweets that you eat. Talk with your health  care provider about whether it is safe for you to drink red wine in moderation. This means 1 glass a day for nonpregnant women and 2 glasses a day for men. A glass of wine equals 5 oz (150 mL). This information is not intended to replace advice given to you by your health care provider. Make sure you discuss any questions you  have with your health care provider. Document Released: 07/17/2016 Document Revised: 08/19/2016 Document Reviewed: 07/17/2016 Elsevier Interactive Patient Education  2017 [employer].

## 2023-03-26 DIAGNOSIS — N183 Chronic kidney disease, stage 3 unspecified: Secondary | ICD-10-CM | POA: Diagnosis not present

## 2023-03-26 DIAGNOSIS — J4489 Other specified chronic obstructive pulmonary disease: Secondary | ICD-10-CM | POA: Diagnosis not present

## 2023-03-26 DIAGNOSIS — Z85038 Personal history of other malignant neoplasm of large intestine: Secondary | ICD-10-CM | POA: Diagnosis not present

## 2023-03-26 DIAGNOSIS — E039 Hypothyroidism, unspecified: Secondary | ICD-10-CM | POA: Diagnosis not present

## 2023-03-26 DIAGNOSIS — I129 Hypertensive chronic kidney disease with stage 1 through stage 4 chronic kidney disease, or unspecified chronic kidney disease: Secondary | ICD-10-CM | POA: Diagnosis not present

## 2023-03-26 DIAGNOSIS — I471 Supraventricular tachycardia, unspecified: Secondary | ICD-10-CM | POA: Diagnosis not present

## 2023-03-26 DIAGNOSIS — R413 Other amnesia: Secondary | ICD-10-CM | POA: Diagnosis not present

## 2023-03-26 DIAGNOSIS — N390 Urinary tract infection, site not specified: Secondary | ICD-10-CM | POA: Diagnosis not present

## 2023-03-26 DIAGNOSIS — Z7951 Long term (current) use of inhaled steroids: Secondary | ICD-10-CM | POA: Diagnosis not present

## 2023-03-26 DIAGNOSIS — M8580 Other specified disorders of bone density and structure, unspecified site: Secondary | ICD-10-CM | POA: Diagnosis not present

## 2023-03-26 DIAGNOSIS — E673 Hypervitaminosis D: Secondary | ICD-10-CM | POA: Diagnosis not present

## 2023-03-26 DIAGNOSIS — Z9181 History of falling: Secondary | ICD-10-CM | POA: Diagnosis not present

## 2023-03-26 DIAGNOSIS — R195 Other fecal abnormalities: Secondary | ICD-10-CM | POA: Diagnosis not present

## 2023-03-26 DIAGNOSIS — Z853 Personal history of malignant neoplasm of breast: Secondary | ICD-10-CM | POA: Diagnosis not present

## 2023-03-26 DIAGNOSIS — Z556 Problems related to health literacy: Secondary | ICD-10-CM | POA: Diagnosis not present

## 2023-04-08 DIAGNOSIS — E673 Hypervitaminosis D: Secondary | ICD-10-CM | POA: Diagnosis not present

## 2023-04-08 DIAGNOSIS — Z9181 History of falling: Secondary | ICD-10-CM | POA: Diagnosis not present

## 2023-04-08 DIAGNOSIS — M8580 Other specified disorders of bone density and structure, unspecified site: Secondary | ICD-10-CM | POA: Diagnosis not present

## 2023-04-08 DIAGNOSIS — Z7951 Long term (current) use of inhaled steroids: Secondary | ICD-10-CM | POA: Diagnosis not present

## 2023-04-08 DIAGNOSIS — I129 Hypertensive chronic kidney disease with stage 1 through stage 4 chronic kidney disease, or unspecified chronic kidney disease: Secondary | ICD-10-CM | POA: Diagnosis not present

## 2023-04-08 DIAGNOSIS — Z853 Personal history of malignant neoplasm of breast: Secondary | ICD-10-CM | POA: Diagnosis not present

## 2023-04-08 DIAGNOSIS — N183 Chronic kidney disease, stage 3 unspecified: Secondary | ICD-10-CM | POA: Diagnosis not present

## 2023-04-08 DIAGNOSIS — Z556 Problems related to health literacy: Secondary | ICD-10-CM | POA: Diagnosis not present

## 2023-04-08 DIAGNOSIS — R195 Other fecal abnormalities: Secondary | ICD-10-CM | POA: Diagnosis not present

## 2023-04-08 DIAGNOSIS — I471 Supraventricular tachycardia, unspecified: Secondary | ICD-10-CM | POA: Diagnosis not present

## 2023-04-08 DIAGNOSIS — E039 Hypothyroidism, unspecified: Secondary | ICD-10-CM | POA: Diagnosis not present

## 2023-04-08 DIAGNOSIS — Z85038 Personal history of other malignant neoplasm of large intestine: Secondary | ICD-10-CM | POA: Diagnosis not present

## 2023-04-08 DIAGNOSIS — R413 Other amnesia: Secondary | ICD-10-CM | POA: Diagnosis not present

## 2023-04-08 DIAGNOSIS — J4489 Other specified chronic obstructive pulmonary disease: Secondary | ICD-10-CM | POA: Diagnosis not present

## 2023-04-08 DIAGNOSIS — N390 Urinary tract infection, site not specified: Secondary | ICD-10-CM | POA: Diagnosis not present

## 2023-04-10 IMAGING — MG MM DIGITAL DIAGNOSTIC UNILAT*L* W/ TOMO W/ CAD
4 series · 4 of 12 positions shown · non-contrast
Comparison: Previous exam(s).

CLINICAL DATA: Diffuse pain in the LEFT breast.  RIGHT mastectomy.

EXAM:
DIGITAL DIAGNOSTIC UNILATERAL LEFT MAMMOGRAM WITH TOMOSYNTHESIS AND
CAD
TECHNIQUE: Left digital diagnostic mammography and breast tomosynthesis was
performed. The images were evaluated with computer-aided detection.

[L CC synth-2D]
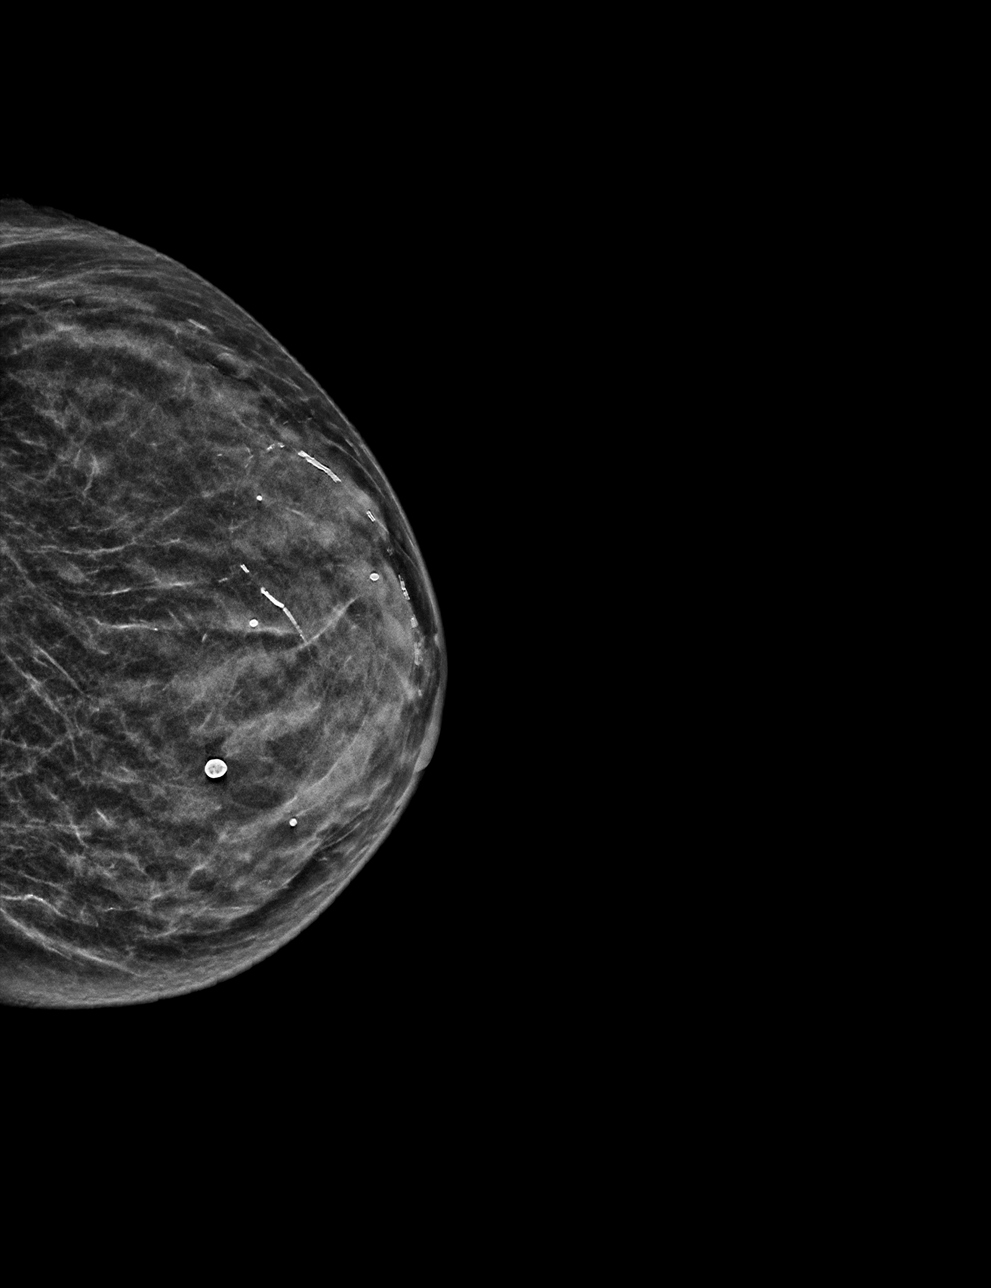

[L MLO synth-2D]
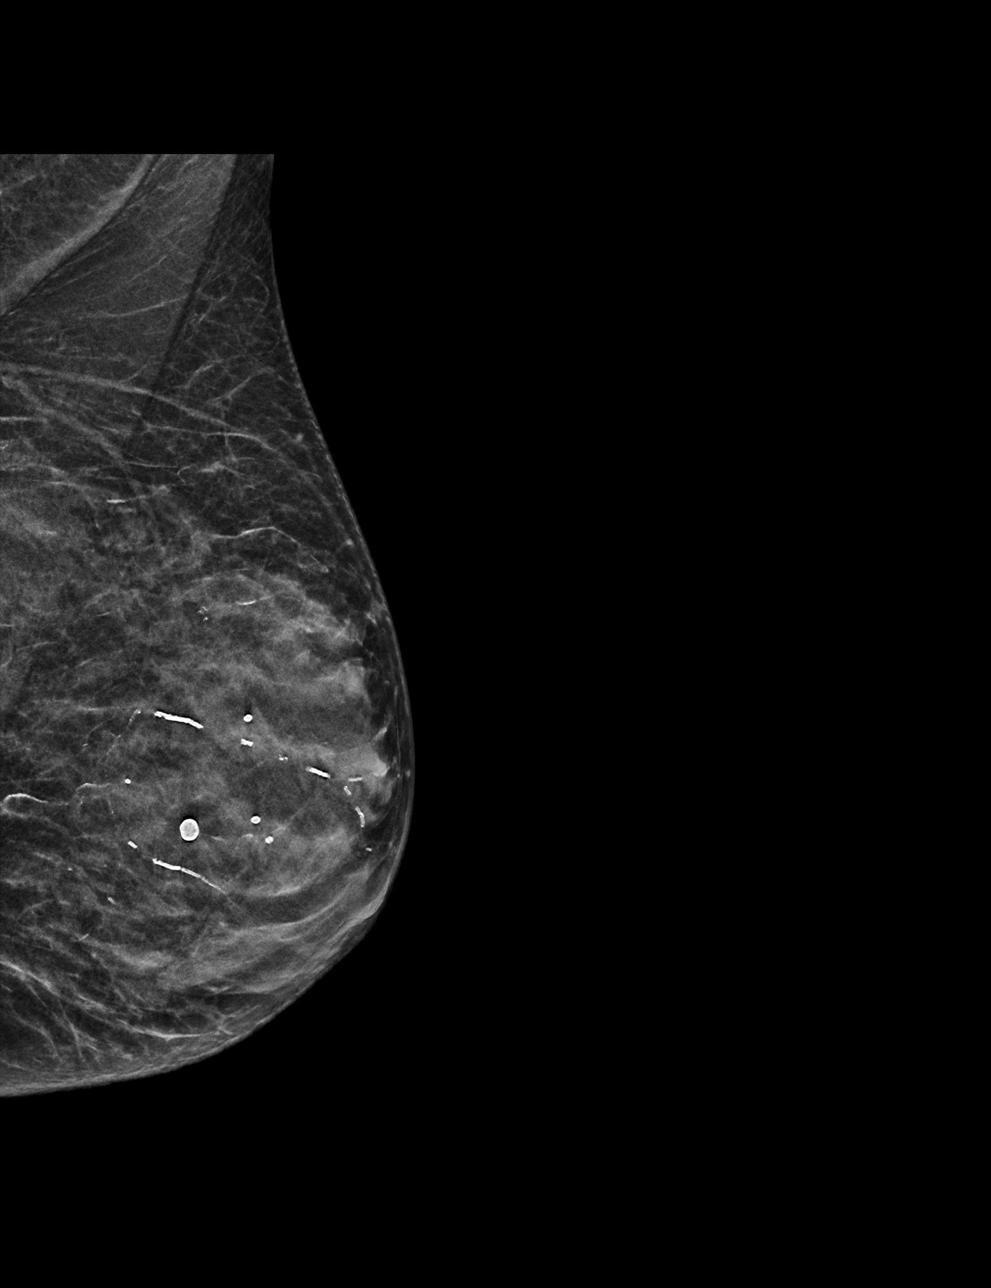

[L MLO tomo · tomo slice 19/37.0]
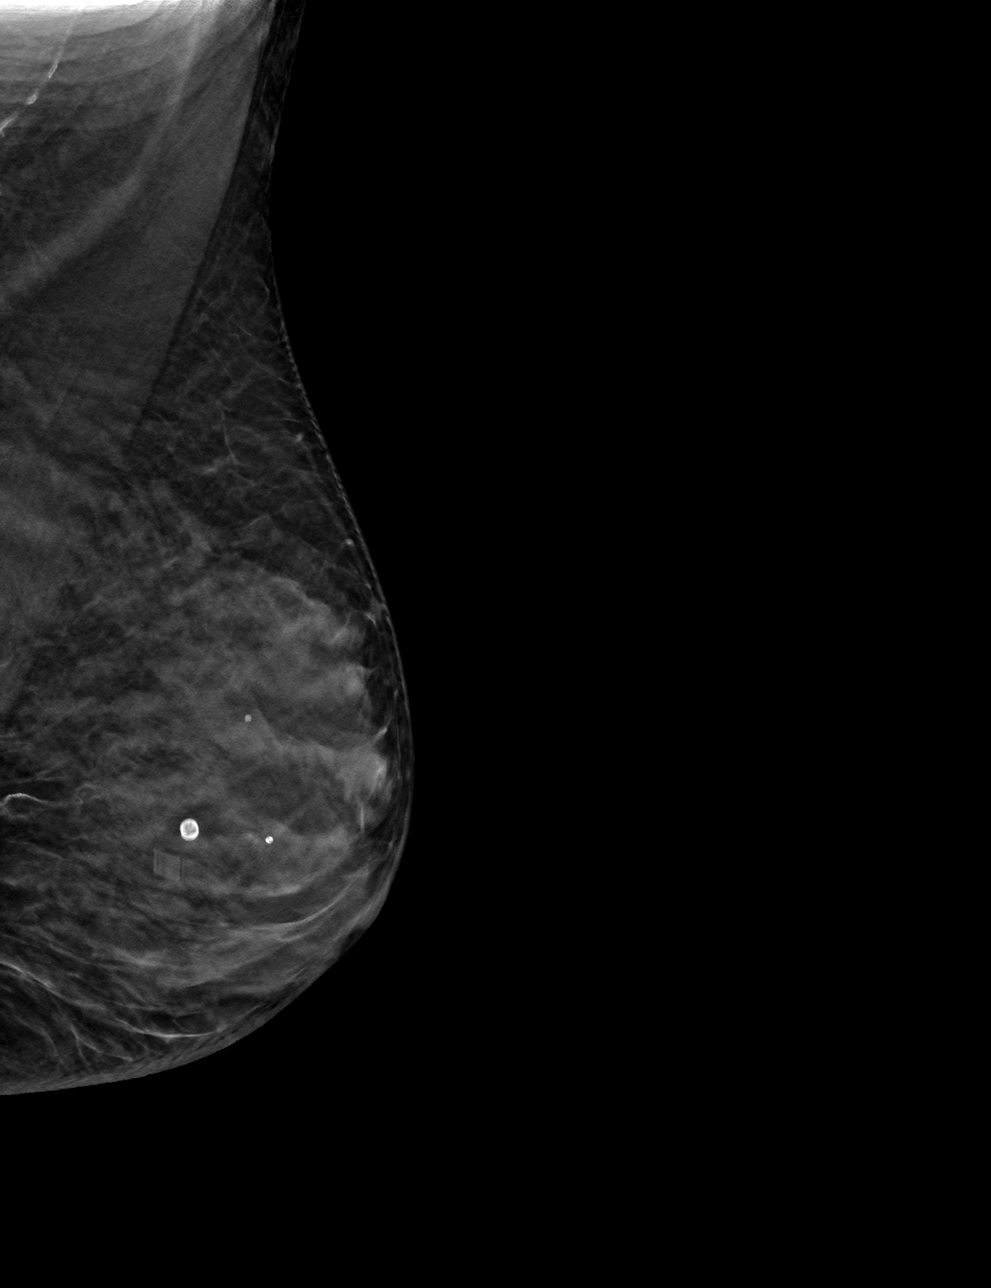

[L CC tomo · tomo slice 18/35.0]
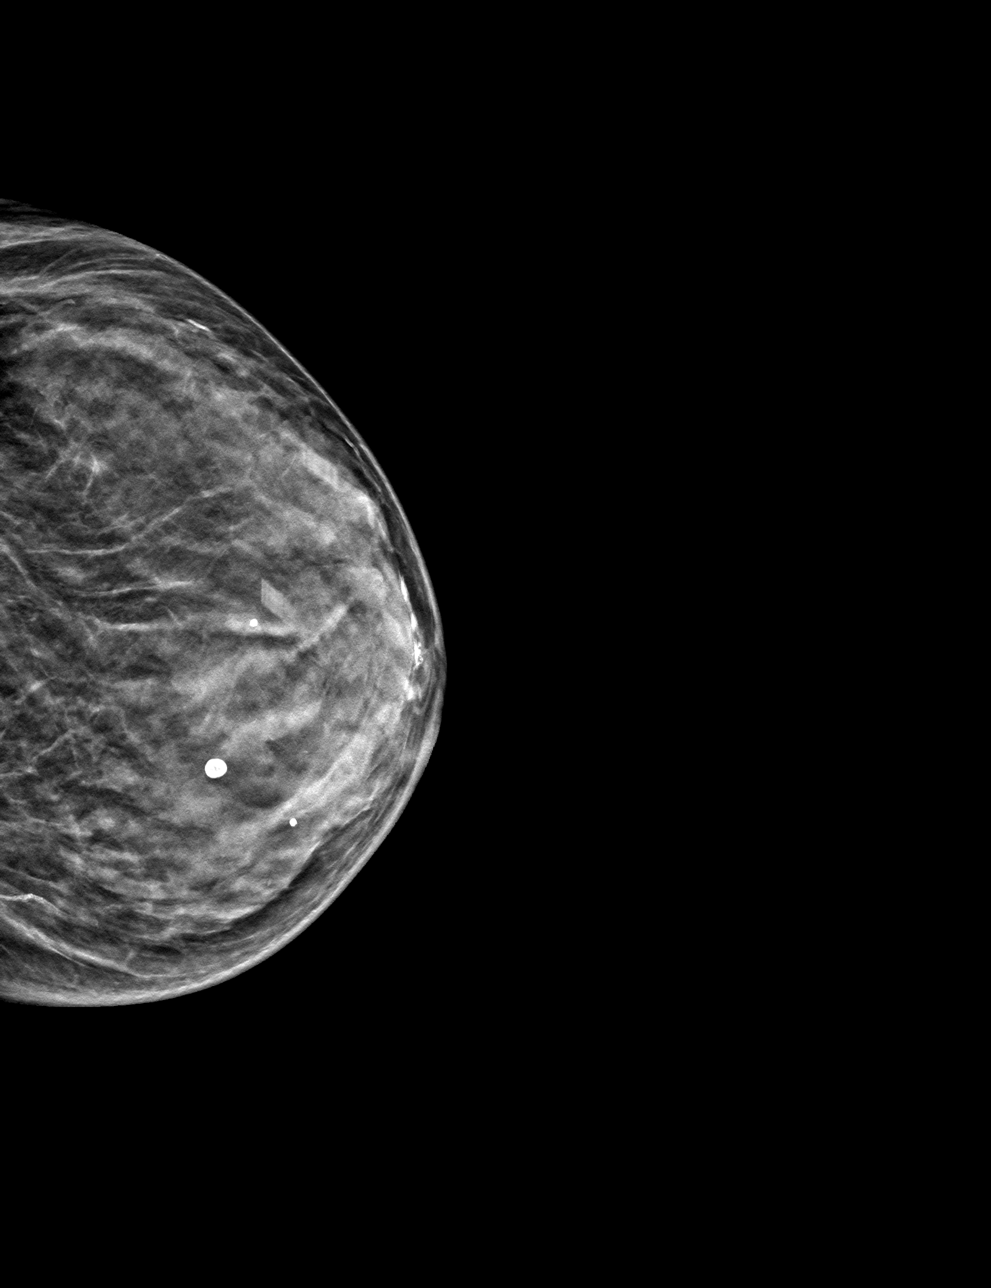

[4 of 12 positions shown; findings below may reference images not displayed]

ACR Breast Density Category c: The breast tissue is heterogeneously
dense, which may obscure small masses.
FINDINGS: No suspicious mass, distortion, or microcalcifications are
identified to suggest presence of malignancy.

On physical exam, patient is tender overlying the slightly prominent
costochondral junctions deep to the UPPER and INNER portions of the
LEFT breast and chest wall. I palpate no breast mass in this region.
IMPRESSION: No mammographic evidence for malignancy.

Suspect costochondritis as a source of the patient's tenderness.

RECOMMENDATION:
LEFT screening mammogram is recommended in 1 year.

I have discussed the findings and recommendations with the patient.
If applicable, a reminder letter will be sent to the patient
regarding the next appointment.

BI-RADS CATEGORY  1: Negative.

## 2023-04-21 DIAGNOSIS — R413 Other amnesia: Secondary | ICD-10-CM | POA: Diagnosis not present

## 2023-04-21 DIAGNOSIS — E039 Hypothyroidism, unspecified: Secondary | ICD-10-CM | POA: Diagnosis not present

## 2023-04-21 DIAGNOSIS — N183 Chronic kidney disease, stage 3 unspecified: Secondary | ICD-10-CM | POA: Diagnosis not present

## 2023-04-21 DIAGNOSIS — E673 Hypervitaminosis D: Secondary | ICD-10-CM | POA: Diagnosis not present

## 2023-04-21 DIAGNOSIS — Z853 Personal history of malignant neoplasm of breast: Secondary | ICD-10-CM | POA: Diagnosis not present

## 2023-04-21 DIAGNOSIS — M8580 Other specified disorders of bone density and structure, unspecified site: Secondary | ICD-10-CM | POA: Diagnosis not present

## 2023-04-21 DIAGNOSIS — N390 Urinary tract infection, site not specified: Secondary | ICD-10-CM | POA: Diagnosis not present

## 2023-04-21 DIAGNOSIS — Z7951 Long term (current) use of inhaled steroids: Secondary | ICD-10-CM | POA: Diagnosis not present

## 2023-04-21 DIAGNOSIS — Z85038 Personal history of other malignant neoplasm of large intestine: Secondary | ICD-10-CM | POA: Diagnosis not present

## 2023-04-21 DIAGNOSIS — J4489 Other specified chronic obstructive pulmonary disease: Secondary | ICD-10-CM | POA: Diagnosis not present

## 2023-04-21 DIAGNOSIS — I471 Supraventricular tachycardia, unspecified: Secondary | ICD-10-CM | POA: Diagnosis not present

## 2023-04-21 DIAGNOSIS — Z556 Problems related to health literacy: Secondary | ICD-10-CM | POA: Diagnosis not present

## 2023-04-21 DIAGNOSIS — Z9181 History of falling: Secondary | ICD-10-CM | POA: Diagnosis not present

## 2023-04-21 DIAGNOSIS — I129 Hypertensive chronic kidney disease with stage 1 through stage 4 chronic kidney disease, or unspecified chronic kidney disease: Secondary | ICD-10-CM | POA: Diagnosis not present

## 2023-04-21 DIAGNOSIS — R195 Other fecal abnormalities: Secondary | ICD-10-CM | POA: Diagnosis not present

## 2023-05-01 DIAGNOSIS — C792 Secondary malignant neoplasm of skin: Secondary | ICD-10-CM | POA: Diagnosis not present

## 2023-05-01 DIAGNOSIS — D485 Neoplasm of uncertain behavior of skin: Secondary | ICD-10-CM | POA: Diagnosis not present

## 2023-05-01 DIAGNOSIS — C44591 Other specified malignant neoplasm of skin of breast: Secondary | ICD-10-CM | POA: Diagnosis not present

## 2023-05-01 DIAGNOSIS — Z853 Personal history of malignant neoplasm of breast: Secondary | ICD-10-CM | POA: Diagnosis not present

## 2023-05-14 DIAGNOSIS — Z85038 Personal history of other malignant neoplasm of large intestine: Secondary | ICD-10-CM | POA: Diagnosis not present

## 2023-05-14 DIAGNOSIS — Z853 Personal history of malignant neoplasm of breast: Secondary | ICD-10-CM | POA: Diagnosis not present

## 2023-05-14 DIAGNOSIS — C4490 Unspecified malignant neoplasm of skin, unspecified: Secondary | ICD-10-CM | POA: Diagnosis not present

## 2023-05-20 DIAGNOSIS — C50912 Malignant neoplasm of unspecified site of left female breast: Secondary | ICD-10-CM | POA: Diagnosis not present

## 2023-05-20 DIAGNOSIS — M899 Disorder of bone, unspecified: Secondary | ICD-10-CM | POA: Diagnosis not present

## 2023-05-20 DIAGNOSIS — Z79899 Other long term (current) drug therapy: Secondary | ICD-10-CM | POA: Diagnosis not present

## 2023-05-20 DIAGNOSIS — Z85038 Personal history of other malignant neoplasm of large intestine: Secondary | ICD-10-CM | POA: Diagnosis not present

## 2023-05-20 DIAGNOSIS — Z853 Personal history of malignant neoplasm of breast: Secondary | ICD-10-CM | POA: Diagnosis not present

## 2023-05-22 DIAGNOSIS — C50919 Malignant neoplasm of unspecified site of unspecified female breast: Secondary | ICD-10-CM | POA: Diagnosis not present

## 2023-05-22 DIAGNOSIS — Z170 Estrogen receptor positive status [ER+]: Secondary | ICD-10-CM | POA: Diagnosis not present

## 2023-05-22 DIAGNOSIS — Z853 Personal history of malignant neoplasm of breast: Secondary | ICD-10-CM | POA: Diagnosis not present

## 2023-05-22 DIAGNOSIS — Z85038 Personal history of other malignant neoplasm of large intestine: Secondary | ICD-10-CM | POA: Diagnosis not present

## 2023-05-25 ENCOUNTER — Other Ambulatory Visit: Payer: Self-pay | Admitting: Cardiology

## 2023-05-25 DIAGNOSIS — I471 Supraventricular tachycardia, unspecified: Secondary | ICD-10-CM

## 2023-06-04 ENCOUNTER — Other Ambulatory Visit: Payer: Self-pay | Admitting: Cardiology

## 2023-06-04 DIAGNOSIS — I471 Supraventricular tachycardia, unspecified: Secondary | ICD-10-CM

## 2023-06-25 DIAGNOSIS — Z79899 Other long term (current) drug therapy: Secondary | ICD-10-CM | POA: Diagnosis not present

## 2023-06-25 DIAGNOSIS — C7951 Secondary malignant neoplasm of bone: Secondary | ICD-10-CM | POA: Diagnosis not present

## 2023-06-25 DIAGNOSIS — C50919 Malignant neoplasm of unspecified site of unspecified female breast: Secondary | ICD-10-CM | POA: Diagnosis not present

## 2023-06-25 DIAGNOSIS — Z170 Estrogen receptor positive status [ER+]: Secondary | ICD-10-CM | POA: Diagnosis not present

## 2023-06-25 DIAGNOSIS — Z79811 Long term (current) use of aromatase inhibitors: Secondary | ICD-10-CM | POA: Diagnosis not present

## 2023-07-23 DIAGNOSIS — Z79899 Other long term (current) drug therapy: Secondary | ICD-10-CM | POA: Diagnosis not present

## 2023-07-23 DIAGNOSIS — Z170 Estrogen receptor positive status [ER+]: Secondary | ICD-10-CM | POA: Diagnosis not present

## 2023-07-23 DIAGNOSIS — C50919 Malignant neoplasm of unspecified site of unspecified female breast: Secondary | ICD-10-CM | POA: Diagnosis not present

## 2023-07-23 DIAGNOSIS — C7951 Secondary malignant neoplasm of bone: Secondary | ICD-10-CM | POA: Diagnosis not present

## 2023-07-23 DIAGNOSIS — Z79811 Long term (current) use of aromatase inhibitors: Secondary | ICD-10-CM | POA: Diagnosis not present

## 2023-08-20 DIAGNOSIS — Z79811 Long term (current) use of aromatase inhibitors: Secondary | ICD-10-CM | POA: Diagnosis not present

## 2023-08-20 DIAGNOSIS — Z170 Estrogen receptor positive status [ER+]: Secondary | ICD-10-CM | POA: Diagnosis not present

## 2023-08-20 DIAGNOSIS — C7951 Secondary malignant neoplasm of bone: Secondary | ICD-10-CM | POA: Diagnosis not present

## 2023-08-20 DIAGNOSIS — C50919 Malignant neoplasm of unspecified site of unspecified female breast: Secondary | ICD-10-CM | POA: Diagnosis not present

## 2023-08-20 DIAGNOSIS — Z79899 Other long term (current) drug therapy: Secondary | ICD-10-CM | POA: Diagnosis not present

## 2023-08-22 ENCOUNTER — Other Ambulatory Visit: Payer: Self-pay | Admitting: Cardiology

## 2023-08-22 DIAGNOSIS — I471 Supraventricular tachycardia, unspecified: Secondary | ICD-10-CM

## 2023-08-28 ENCOUNTER — Other Ambulatory Visit: Payer: Self-pay | Admitting: Cardiology

## 2023-08-28 DIAGNOSIS — I471 Supraventricular tachycardia, unspecified: Secondary | ICD-10-CM

## 2023-09-02 ENCOUNTER — Other Ambulatory Visit: Payer: Self-pay | Admitting: Cardiology

## 2023-09-02 DIAGNOSIS — I471 Supraventricular tachycardia, unspecified: Secondary | ICD-10-CM

## 2023-09-08 DIAGNOSIS — I700 Atherosclerosis of aorta: Secondary | ICD-10-CM | POA: Diagnosis not present

## 2023-09-08 DIAGNOSIS — E559 Vitamin D deficiency, unspecified: Secondary | ICD-10-CM | POA: Diagnosis not present

## 2023-09-08 DIAGNOSIS — E039 Hypothyroidism, unspecified: Secondary | ICD-10-CM | POA: Diagnosis not present

## 2023-09-15 DIAGNOSIS — R636 Underweight: Secondary | ICD-10-CM | POA: Diagnosis not present

## 2023-09-15 DIAGNOSIS — E039 Hypothyroidism, unspecified: Secondary | ICD-10-CM | POA: Diagnosis not present

## 2023-09-15 DIAGNOSIS — Z853 Personal history of malignant neoplasm of breast: Secondary | ICD-10-CM | POA: Diagnosis not present

## 2023-09-15 DIAGNOSIS — I10 Essential (primary) hypertension: Secondary | ICD-10-CM | POA: Diagnosis not present

## 2023-09-15 DIAGNOSIS — I700 Atherosclerosis of aorta: Secondary | ICD-10-CM | POA: Diagnosis not present

## 2023-09-15 DIAGNOSIS — N183 Chronic kidney disease, stage 3 unspecified: Secondary | ICD-10-CM | POA: Diagnosis not present

## 2023-09-15 DIAGNOSIS — E559 Vitamin D deficiency, unspecified: Secondary | ICD-10-CM | POA: Diagnosis not present

## 2023-09-15 DIAGNOSIS — J432 Centrilobular emphysema: Secondary | ICD-10-CM | POA: Diagnosis not present

## 2023-09-17 DIAGNOSIS — Z79899 Other long term (current) drug therapy: Secondary | ICD-10-CM | POA: Diagnosis not present

## 2023-09-17 DIAGNOSIS — C7951 Secondary malignant neoplasm of bone: Secondary | ICD-10-CM | POA: Diagnosis not present

## 2023-09-17 DIAGNOSIS — Z9011 Acquired absence of right breast and nipple: Secondary | ICD-10-CM | POA: Diagnosis not present

## 2023-09-17 DIAGNOSIS — J90 Pleural effusion, not elsewhere classified: Secondary | ICD-10-CM | POA: Diagnosis not present

## 2023-09-17 DIAGNOSIS — C50911 Malignant neoplasm of unspecified site of right female breast: Secondary | ICD-10-CM | POA: Diagnosis not present

## 2023-09-17 DIAGNOSIS — J984 Other disorders of lung: Secondary | ICD-10-CM | POA: Diagnosis not present

## 2023-09-17 DIAGNOSIS — Z483 Aftercare following surgery for neoplasm: Secondary | ICD-10-CM | POA: Diagnosis not present

## 2023-09-17 DIAGNOSIS — C50919 Malignant neoplasm of unspecified site of unspecified female breast: Secondary | ICD-10-CM | POA: Diagnosis not present

## 2023-09-17 DIAGNOSIS — Z9882 Breast implant status: Secondary | ICD-10-CM | POA: Diagnosis not present

## 2023-09-24 DIAGNOSIS — Z5181 Encounter for therapeutic drug level monitoring: Secondary | ICD-10-CM | POA: Diagnosis not present

## 2023-09-24 DIAGNOSIS — Z79899 Other long term (current) drug therapy: Secondary | ICD-10-CM | POA: Diagnosis not present

## 2023-09-24 DIAGNOSIS — C7951 Secondary malignant neoplasm of bone: Secondary | ICD-10-CM | POA: Diagnosis not present

## 2023-09-24 DIAGNOSIS — C50919 Malignant neoplasm of unspecified site of unspecified female breast: Secondary | ICD-10-CM | POA: Diagnosis not present

## 2023-09-24 DIAGNOSIS — Z170 Estrogen receptor positive status [ER+]: Secondary | ICD-10-CM | POA: Diagnosis not present

## 2023-09-24 DIAGNOSIS — Z7983 Long term (current) use of bisphosphonates: Secondary | ICD-10-CM | POA: Diagnosis not present

## 2023-09-24 DIAGNOSIS — Z79811 Long term (current) use of aromatase inhibitors: Secondary | ICD-10-CM | POA: Diagnosis not present

## 2023-09-28 ENCOUNTER — Other Ambulatory Visit: Payer: Self-pay | Admitting: Cardiology

## 2023-09-28 DIAGNOSIS — I471 Supraventricular tachycardia, unspecified: Secondary | ICD-10-CM

## 2023-09-29 ENCOUNTER — Other Ambulatory Visit: Payer: Self-pay | Admitting: Cardiology

## 2023-09-29 DIAGNOSIS — I471 Supraventricular tachycardia, unspecified: Secondary | ICD-10-CM

## 2023-10-22 DIAGNOSIS — Z79811 Long term (current) use of aromatase inhibitors: Secondary | ICD-10-CM | POA: Diagnosis not present

## 2023-10-22 DIAGNOSIS — C50919 Malignant neoplasm of unspecified site of unspecified female breast: Secondary | ICD-10-CM | POA: Diagnosis not present

## 2023-10-22 DIAGNOSIS — Z5181 Encounter for therapeutic drug level monitoring: Secondary | ICD-10-CM | POA: Diagnosis not present

## 2023-10-22 DIAGNOSIS — C7951 Secondary malignant neoplasm of bone: Secondary | ICD-10-CM | POA: Diagnosis not present

## 2023-10-22 DIAGNOSIS — Z170 Estrogen receptor positive status [ER+]: Secondary | ICD-10-CM | POA: Diagnosis not present

## 2023-10-22 DIAGNOSIS — Z853 Personal history of malignant neoplasm of breast: Secondary | ICD-10-CM | POA: Diagnosis not present

## 2023-10-22 DIAGNOSIS — Z79899 Other long term (current) drug therapy: Secondary | ICD-10-CM | POA: Diagnosis not present

## 2023-10-22 DIAGNOSIS — Z7983 Long term (current) use of bisphosphonates: Secondary | ICD-10-CM | POA: Diagnosis not present

## 2023-10-27 ENCOUNTER — Other Ambulatory Visit: Payer: Self-pay | Admitting: Cardiology

## 2023-10-27 DIAGNOSIS — I471 Supraventricular tachycardia, unspecified: Secondary | ICD-10-CM

## 2023-11-02 ENCOUNTER — Encounter: Payer: Self-pay | Admitting: Cardiology

## 2023-11-02 ENCOUNTER — Ambulatory Visit: Payer: Medicare Other | Attending: Cardiology | Admitting: Cardiology

## 2023-11-02 VITALS — BP 130/64 | HR 56 | Ht 64.0 in | Wt 106.6 lb

## 2023-11-02 DIAGNOSIS — I351 Nonrheumatic aortic (valve) insufficiency: Secondary | ICD-10-CM

## 2023-11-02 NOTE — Patient Instructions (Signed)
Medication Instructions:   Your physician recommends that you continue on your current medications as directed. Please refer to the Current Medication list given to you today.  *If you need a refill on your cardiac medications before your next appointment, please call your pharmacy*    Follow-Up: At [facility], you and your health needs are our priority.  As part of our continuing mission to provide you with exceptional heart care, we have created designated Provider Care Teams.  These Care Teams include your primary Cardiologist (physician) and Advanced Practice Providers (APPs -  Physician Assistants and Nurse Practitioners) who all work together to provide you with the care you need, when you need it.  We recommend signing up for the patient portal called "MyChart".  Sign up information is provided on this After Visit Summary.  MyChart is used to connect with patients for Virtual Visits (Telemedicine).  Patients are able to view lab/test results, encounter notes, upcoming appointments, etc.  Non-urgent messages can be sent to your provider as well.   To learn more about what you can do with MyChart, go to [email or web address].    Your next appointment:   6 month(s)  Provider:   Dr. Rosemary Holms

## 2023-11-02 NOTE — Progress Notes (Signed)
Cardiology Office Note:  .   Date:  11/02/2023  ID:  [name], DOB November 13, 1933, MRN [id number] PCP: Victoria Brunette, MD  [facility] Providers Cardiologist:  Victoria Mainland, MD PCP: Victoria Brunette, MD  Chief Complaint  Patient presents with   Palpitations      History of Present Illness: [name] Kitchen    [name] is a 89 y.o. female with hypertension, hypothyroidism, mod AI, TR, recurrent breast cancer, dementia   Patient is doing well from cardiac standpoint, denies any complaints of palpitations, dyspnea, leg edema.  Unfortunately, she has been diagnosed with recurrence of breast cancer after 40 years and is undergoing immunotherapy for the same.  She is tolerating it fairly well.  She also being treated for for possible dementia.  Patient had an ER visit with chest pain in 01/2023 with acute MI was excluded.  She has not had any recurrence since then, but has had for further noncardiac issues ongoing including breast cancer.  Vitals:   11/02/23 1606  BP: 130/64  Pulse: (!) 56  SpO2: 96%     ROS:  Review of Systems  Cardiovascular:  Negative for chest pain, dyspnea on exertion, leg swelling, palpitations and syncope.     Studies Reviewed: [name] Kitchen        EKG 11/02/2023: Sinus rhythm with marked sinus arrhythmia Minimal voltage criteria for LVH, may be normal variant ( Sokolow-Lyon ) Septal infarct , age undetermined ST & T wave abnormality, consider inferolateral ischemia When compared with ECG of 15-Jan-2023 12:04, No significant change since    Independently interpreted Labs 09/08/2023: Chol 247, TG 85, HL 114, LDL 119  Echocardiogram 06/16/2022:  Left ventricle cavity is normal in size and wall thickness. Normal global  wall motion. Normal LV systolic function with EF 59%. Indeterminate  diastolic filling pattern. Calculated EF 59%.  Left atrial cavity is severely dilated.  Trileaflet aortic valve.  Moderate (Grade II) aortic regurgitation.  Mild to  moderate mitral regurgitation.  Moderate tricuspid regurgitation. Estimated pulmonary artery systolic  pressure 32 mmHg.  Mild pulmonic regurgitation.  Previous study on 01/08/2021 showed moderate left atrial dilatation. No  other significant change noted.   Mobile cardiac telemetry 13 days 06/27/2022 - 07/11/2022: Dominant rhythm: Sinus. HR 41-89 bpm. Avg HR 63 bpm, in sinus rhythm. >2000 episodes of SVT/atrial tachycardia, fastest at 187 bpm for 3 min 13 sec, longest for 7 min 13 sec at 138 bpm. 14.4% isolated SVE, 4.7% couplets, 3% triplets. 0 episodes of VT. <1% isolated VE, couplets. No convincing evidence of atrial fibrillation. Some episodes marked as AF are likely atrial tachycardia with variable AV conduction  No VT/high grade AV block, sinus pause >3sec noted. 0 patient triggered events.     Physical Exam:   Physical Exam Vitals and nursing note reviewed.  Constitutional:      General: She is not in acute distress. Neck:     Vascular: No JVD.  Cardiovascular:     Rate and Rhythm: Normal rate and regular rhythm. Occasional Extrasystoles are present.    Heart sounds: Normal heart sounds. No murmur heard. Pulmonary:     Effort: Pulmonary effort is normal.     Breath sounds: Normal breath sounds. No wheezing or rales.  Musculoskeletal:     Right lower leg: No edema.     Left lower leg: No edema.      VISIT DIAGNOSES:   ICD-10-CM   1. Nonrheumatic aortic valve insufficiency  I35.1 EKG 12-Lead  ASSESSMENT AND PLAN: .    [name] is a 89 y.o. female with hypertension, hypothyroidism, mod AI, TR, recurrent breast cancer, dementia  AI, TR: Clinically, asymptomatic.  No loud murmur heard on exam.  Given her other ongoing medical problems including breast cancer, patient will like to hold off repeat echocardiogram at this time.  I will see her back in 6 months.  At that time, if breast cancer treatment is completed and if patient is having any new cardiac  symptoms, we will then repeat echocardiogram at that time.   F/u in 6 months  Signed, Victoria Negus, MD

## 2023-11-08 ENCOUNTER — Other Ambulatory Visit: Payer: Self-pay | Admitting: Cardiology

## 2023-11-08 DIAGNOSIS — I471 Supraventricular tachycardia, unspecified: Secondary | ICD-10-CM

## 2023-11-19 DIAGNOSIS — C50919 Malignant neoplasm of unspecified site of unspecified female breast: Secondary | ICD-10-CM | POA: Diagnosis not present

## 2023-11-19 DIAGNOSIS — Z170 Estrogen receptor positive status [ER+]: Secondary | ICD-10-CM | POA: Diagnosis not present

## 2023-11-19 DIAGNOSIS — Z79811 Long term (current) use of aromatase inhibitors: Secondary | ICD-10-CM | POA: Diagnosis not present

## 2023-11-19 DIAGNOSIS — Z79899 Other long term (current) drug therapy: Secondary | ICD-10-CM | POA: Diagnosis not present

## 2023-11-19 DIAGNOSIS — C7951 Secondary malignant neoplasm of bone: Secondary | ICD-10-CM | POA: Diagnosis not present

## 2023-11-23 ENCOUNTER — Other Ambulatory Visit: Payer: Self-pay | Admitting: Cardiology

## 2023-11-23 DIAGNOSIS — I471 Supraventricular tachycardia, unspecified: Secondary | ICD-10-CM

## 2023-12-17 DIAGNOSIS — Z79899 Other long term (current) drug therapy: Secondary | ICD-10-CM | POA: Diagnosis not present

## 2023-12-17 DIAGNOSIS — Z7983 Long term (current) use of bisphosphonates: Secondary | ICD-10-CM | POA: Diagnosis not present

## 2023-12-17 DIAGNOSIS — R944 Abnormal results of kidney function studies: Secondary | ICD-10-CM | POA: Diagnosis not present

## 2023-12-17 DIAGNOSIS — Z79811 Long term (current) use of aromatase inhibitors: Secondary | ICD-10-CM | POA: Diagnosis not present

## 2023-12-17 DIAGNOSIS — C50919 Malignant neoplasm of unspecified site of unspecified female breast: Secondary | ICD-10-CM | POA: Diagnosis not present

## 2023-12-17 DIAGNOSIS — C7951 Secondary malignant neoplasm of bone: Secondary | ICD-10-CM | POA: Diagnosis not present

## 2023-12-17 DIAGNOSIS — Z5181 Encounter for therapeutic drug level monitoring: Secondary | ICD-10-CM | POA: Diagnosis not present

## 2023-12-17 DIAGNOSIS — Z170 Estrogen receptor positive status [ER+]: Secondary | ICD-10-CM | POA: Diagnosis not present

## 2024-01-07 DIAGNOSIS — M7989 Other specified soft tissue disorders: Secondary | ICD-10-CM | POA: Diagnosis not present

## 2024-01-07 DIAGNOSIS — C7951 Secondary malignant neoplasm of bone: Secondary | ICD-10-CM | POA: Diagnosis not present

## 2024-01-07 DIAGNOSIS — C50919 Malignant neoplasm of unspecified site of unspecified female breast: Secondary | ICD-10-CM | POA: Diagnosis not present

## 2024-01-07 DIAGNOSIS — Z131 Encounter for screening for diabetes mellitus: Secondary | ICD-10-CM | POA: Diagnosis not present

## 2024-01-07 DIAGNOSIS — I700 Atherosclerosis of aorta: Secondary | ICD-10-CM | POA: Diagnosis not present

## 2024-01-07 DIAGNOSIS — Z9011 Acquired absence of right breast and nipple: Secondary | ICD-10-CM | POA: Diagnosis not present

## 2024-01-14 DIAGNOSIS — Z79899 Other long term (current) drug therapy: Secondary | ICD-10-CM | POA: Diagnosis not present

## 2024-01-14 DIAGNOSIS — Z79811 Long term (current) use of aromatase inhibitors: Secondary | ICD-10-CM | POA: Diagnosis not present

## 2024-01-14 DIAGNOSIS — C7951 Secondary malignant neoplasm of bone: Secondary | ICD-10-CM | POA: Diagnosis not present

## 2024-01-14 DIAGNOSIS — Z5181 Encounter for therapeutic drug level monitoring: Secondary | ICD-10-CM | POA: Diagnosis not present

## 2024-01-14 DIAGNOSIS — C50919 Malignant neoplasm of unspecified site of unspecified female breast: Secondary | ICD-10-CM | POA: Diagnosis not present

## 2024-01-14 DIAGNOSIS — Z170 Estrogen receptor positive status [ER+]: Secondary | ICD-10-CM | POA: Diagnosis not present

## 2024-01-19 DIAGNOSIS — D61818 Other pancytopenia: Secondary | ICD-10-CM | POA: Diagnosis not present

## 2024-01-19 DIAGNOSIS — I129 Hypertensive chronic kidney disease with stage 1 through stage 4 chronic kidney disease, or unspecified chronic kidney disease: Secondary | ICD-10-CM | POA: Diagnosis not present

## 2024-01-19 DIAGNOSIS — N183 Chronic kidney disease, stage 3 unspecified: Secondary | ICD-10-CM | POA: Diagnosis not present

## 2024-01-21 DIAGNOSIS — Z170 Estrogen receptor positive status [ER+]: Secondary | ICD-10-CM | POA: Diagnosis not present

## 2024-01-21 DIAGNOSIS — C50919 Malignant neoplasm of unspecified site of unspecified female breast: Secondary | ICD-10-CM | POA: Diagnosis not present

## 2024-01-25 DIAGNOSIS — I129 Hypertensive chronic kidney disease with stage 1 through stage 4 chronic kidney disease, or unspecified chronic kidney disease: Secondary | ICD-10-CM | POA: Diagnosis not present

## 2024-01-25 DIAGNOSIS — N189 Chronic kidney disease, unspecified: Secondary | ICD-10-CM | POA: Diagnosis not present

## 2024-01-25 DIAGNOSIS — N183 Chronic kidney disease, stage 3 unspecified: Secondary | ICD-10-CM | POA: Diagnosis not present

## 2024-01-25 DIAGNOSIS — N281 Cyst of kidney, acquired: Secondary | ICD-10-CM | POA: Diagnosis not present

## 2024-02-18 DIAGNOSIS — Z170 Estrogen receptor positive status [ER+]: Secondary | ICD-10-CM | POA: Diagnosis not present

## 2024-02-18 DIAGNOSIS — Z79899 Other long term (current) drug therapy: Secondary | ICD-10-CM | POA: Diagnosis not present

## 2024-02-18 DIAGNOSIS — Z79811 Long term (current) use of aromatase inhibitors: Secondary | ICD-10-CM | POA: Diagnosis not present

## 2024-02-18 DIAGNOSIS — C50919 Malignant neoplasm of unspecified site of unspecified female breast: Secondary | ICD-10-CM | POA: Diagnosis not present

## 2024-03-02 DIAGNOSIS — I10 Essential (primary) hypertension: Secondary | ICD-10-CM | POA: Diagnosis not present

## 2024-03-02 DIAGNOSIS — Z0000 Encounter for general adult medical examination without abnormal findings: Secondary | ICD-10-CM | POA: Diagnosis not present

## 2024-03-02 DIAGNOSIS — E559 Vitamin D deficiency, unspecified: Secondary | ICD-10-CM | POA: Diagnosis not present

## 2024-03-02 DIAGNOSIS — E7800 Pure hypercholesterolemia, unspecified: Secondary | ICD-10-CM | POA: Diagnosis not present

## 2024-03-02 LAB — LAB REPORT - SCANNED: EGFR: 44

## 2024-03-09 DIAGNOSIS — E039 Hypothyroidism, unspecified: Secondary | ICD-10-CM | POA: Diagnosis not present

## 2024-03-09 DIAGNOSIS — R413 Other amnesia: Secondary | ICD-10-CM | POA: Diagnosis not present

## 2024-03-09 DIAGNOSIS — I10 Essential (primary) hypertension: Secondary | ICD-10-CM | POA: Diagnosis not present

## 2024-03-09 DIAGNOSIS — N183 Chronic kidney disease, stage 3 unspecified: Secondary | ICD-10-CM | POA: Diagnosis not present

## 2024-03-09 DIAGNOSIS — R636 Underweight: Secondary | ICD-10-CM | POA: Diagnosis not present

## 2024-03-09 DIAGNOSIS — Z0000 Encounter for general adult medical examination without abnormal findings: Secondary | ICD-10-CM | POA: Diagnosis not present

## 2024-03-09 DIAGNOSIS — E7800 Pure hypercholesterolemia, unspecified: Secondary | ICD-10-CM | POA: Diagnosis not present

## 2024-03-17 DIAGNOSIS — Z79811 Long term (current) use of aromatase inhibitors: Secondary | ICD-10-CM | POA: Diagnosis not present

## 2024-03-17 DIAGNOSIS — Z79899 Other long term (current) drug therapy: Secondary | ICD-10-CM | POA: Diagnosis not present

## 2024-03-17 DIAGNOSIS — C50919 Malignant neoplasm of unspecified site of unspecified female breast: Secondary | ICD-10-CM | POA: Diagnosis not present

## 2024-03-17 DIAGNOSIS — Z170 Estrogen receptor positive status [ER+]: Secondary | ICD-10-CM | POA: Diagnosis not present

## 2024-03-23 DIAGNOSIS — C50919 Malignant neoplasm of unspecified site of unspecified female breast: Secondary | ICD-10-CM | POA: Diagnosis not present

## 2024-03-23 DIAGNOSIS — Z170 Estrogen receptor positive status [ER+]: Secondary | ICD-10-CM | POA: Diagnosis not present

## 2024-03-23 DIAGNOSIS — Z79899 Other long term (current) drug therapy: Secondary | ICD-10-CM | POA: Diagnosis not present

## 2024-03-23 DIAGNOSIS — Z79811 Long term (current) use of aromatase inhibitors: Secondary | ICD-10-CM | POA: Diagnosis not present

## 2024-04-13 DIAGNOSIS — Z85038 Personal history of other malignant neoplasm of large intestine: Secondary | ICD-10-CM | POA: Diagnosis not present

## 2024-04-13 DIAGNOSIS — Z170 Estrogen receptor positive status [ER+]: Secondary | ICD-10-CM | POA: Diagnosis not present

## 2024-04-13 DIAGNOSIS — C50919 Malignant neoplasm of unspecified site of unspecified female breast: Secondary | ICD-10-CM | POA: Diagnosis not present

## 2024-04-13 DIAGNOSIS — C7951 Secondary malignant neoplasm of bone: Secondary | ICD-10-CM | POA: Diagnosis not present

## 2024-04-13 DIAGNOSIS — C792 Secondary malignant neoplasm of skin: Secondary | ICD-10-CM | POA: Diagnosis not present

## 2024-04-13 DIAGNOSIS — Z79811 Long term (current) use of aromatase inhibitors: Secondary | ICD-10-CM | POA: Diagnosis not present

## 2024-04-13 DIAGNOSIS — Z79899 Other long term (current) drug therapy: Secondary | ICD-10-CM | POA: Diagnosis not present

## 2024-04-13 DIAGNOSIS — Z853 Personal history of malignant neoplasm of breast: Secondary | ICD-10-CM | POA: Diagnosis not present

## 2024-04-19 ENCOUNTER — Ambulatory Visit: Admitting: Physician Assistant

## 2024-04-19 ENCOUNTER — Encounter: Payer: Self-pay | Admitting: Physician Assistant

## 2024-04-19 VITALS — BP 110/54 | HR 64 | Resp 20 | Ht 64.0 in

## 2024-04-19 DIAGNOSIS — F02B0 Dementia in other diseases classified elsewhere, moderate, without behavioral disturbance, psychotic disturbance, mood disturbance, and anxiety: Secondary | ICD-10-CM | POA: Diagnosis not present

## 2024-04-19 DIAGNOSIS — G301 Alzheimer's disease with late onset: Secondary | ICD-10-CM | POA: Diagnosis not present

## 2024-04-19 NOTE — Patient Instructions (Signed)
No follow up indicated.

## 2024-04-19 NOTE — Progress Notes (Signed)
 Assessment/Plan:   Dementia likely due to Alzheimer's Disease, late onset, without behavioral disturbance   [name] is a very pleasant 89 y.o. RH female with a history off hypertension, hyperlipidemia, Qti prolongation, breast cancer on Ibrance, seen today per husband' request to discuss  the prior tests again and to discuss newer agents such as lecanemab. As recalled patient was seen on 03/24/23 with a MoCA of 7/30. CT head at the time showed significant atrophy, including hippocampal atrophy.  In the past, we had discussed antidementia medications, and its side effects, and in view of risks outweighing the benefits of therapy, this was not initiated.   He reports her memory is worse now, unfortunately, Novel agents would not be of benefit either, in view of her stage of disease, with undesirable side effects possible which could include headaches, cerebral hemorrhage, cerebral edema, etc.  Husband admits to be having a very difficult time with the patient's diagnosis, as they have been married 72 years, he may be experiencing caregiver distress syndrome.  We discussed goals of care, and some of the resources that need to be sought, including social worker involvement, distress counseling, etc.  Literature was provided.  The patient needs assistance with some of her ADLs, and her mood is good.  No antidementia medication is indicated, as the risk outweigh the benefits. Recommend adult day program for social and cognitive stimulation Recommend caregiver distress support for her spouse, resources provided Recommend good control of her cardiovascular risk factors Continue to control mood as per PCP Continue following up breast cancer at [facility] No follow-up is indicated      Initial visit March 23, 2023   How long did patient have memory difficulties? May have been present for several years, but over the  last 6 months, she forgets recent conversations "but is good with people's  names". She still likes to do puzzles and play music.  repeats oneself?  Endorsed Disoriented when walking into a room?  Patient denies  Leaving objects in unusual places?  Endorsed, especially in the kitchen  Wandering behavior? denies   Any personality changes since last visit? May be irritable at times but not frequently  Any history of depression?:  denies   Hallucinations or paranoia?  denies   Seizures? denies    Any sleep changes?  Sleeps well. Denies  vivid dreams, REM behavior or sleepwalking   Sleep apnea? denies   Any hygiene concerns?  denies   Independent of bathing and dressing?  Endorsed  Does the patient need help with medications? Husband is in charge   Who is in charge of the finances? Husband  is in charge     Any changes in appetite? " for a while it was decreased, but then improved"     Patient have trouble swallowing?  denies   Does the patient cook? "Some"  Any kitchen accidents such as leaving the stove on? Sometimes she forgets to shut it off  Any headaches?  denies   Chronic back pain?  denies   Ambulates with difficulty? denies   Recent falls or head injuries?In 01/2023 she had a  R shoulder fracture after a fall (mechanical ) . No LOC or head injury Vision changes? Recent B cataract surgery  Unilateral weakness, numbness or tingling?  denies   Any tremors?  denies   Any anosmia?  denies   Any incontinence of urine? Denies.  Any bowel dysfunction?    denies      Patient  lives  with husband   History of heavy alcohol intake? denies   History of heavy tobacco use? denies   Family history of dementia? Aunt had AD   Does patient drive? No longer drives   PREVIOUS MEDICATIONS:   CURRENT MEDICATIONS:  Outpatient Encounter Medications as of 04/19/2024  Medication Sig   acetaminophen  (TYLENOL ) 325 MG tablet Take 2 tablets (650 mg total) by mouth every 6 (six) hours as needed for moderate pain.   ADVAIR DISKUS 100-50 MCG/DOSE AEPB USE 1 PUFF BY MOUTH EVERY12  HOURS. (Patient taking differently: Inhale 1 puff into the lungs every 12 (twelve) hours.)   albuterol  (PROAIR  HFA) 108 (90 BASE) MCG/ACT inhaler Inhale 2 puffs into the lungs every 6 (six) hours as needed for wheezing. Needs to schedule an appt for refills. (Patient taking differently: Inhale 2 puffs into the lungs every 6 (six) hours as needed for wheezing.)   anastrozole (ARIMIDEX) 1 MG tablet Take 1 mg by mouth daily.   cyanocobalamin 1000 MCG tablet Take 1,000 mcg by mouth daily.   diltiazem  (TIAZAC ) 180 MG 24 hr capsule Take 1 capsule (180 mg total) by mouth daily.   fluticasone  (FLONASE) 50 MCG/ACT nasal spray as needed.   IBRANCE 75 MG tablet Take by mouth.   levothyroxine (SYNTHROID) 112 MCG tablet Take 112 mcg by mouth daily.   losartan-hydrochlorothiazide (HYZAAR) 100-25 MG per tablet Take 1 tablet by mouth daily.   metoprolol  succinate (TOPROL -XL) 25 MG 24 hr tablet TAKE ONE TABLET BY MOUTH ONCE A DAY   MYRBETRIQ 25 MG TB24 tablet Take 25 mg by mouth daily.   Vitamin D, Cholecalciferol, 1000 units TABS Take 1,000 Units by mouth daily.    No facility-administered encounter medications on file as of 04/19/2024.        No data to display            03/23/2023    1:00 PM  Montreal Cognitive Assessment   Visuospatial/ Executive (0/5) 1  Naming (0/3) 2  Attention: Read list of digits (0/2) 2  Attention: Read list of letters (0/1) 0  Attention: Serial 7 subtraction starting at 100 (0/3) 0  Language: Repeat phrase (0/2) 0  Language : Fluency (0/1) 0  Abstraction (0/2) 0  Delayed Recall (0/5) 0  Orientation (0/6) 1  Total 6  Adjusted Score (based on education) 7      VITALS:   Vitals:   04/19/24 1054  BP: (!) 110/54  Pulse: 64  Resp: 20  SpO2: 98%  Height: 5\' 4"  (1.626 m)      Total time spent on today's visit was 25 minutes dedicated to this patient today, preparing to see patient, examining the patient, ordering tests and/or medications and counseling the  patient, documenting clinical information in the EHR or other health record, independently interpreting results and communicating results to the patient/family, discussing treatment and goals, answering patient's questions and coordinating care.  Cc:  Imelda Man, MD  Tex Filbert 04/19/2024 4:52 PM

## 2024-04-20 ENCOUNTER — Telehealth: Payer: Self-pay | Admitting: Licensed Clinical Social Worker

## 2024-04-20 NOTE — Telephone Encounter (Signed)
 Patient's daughter requested information and resources for additional care options. LCSW called, no answer, left message  Reeves Canter [phone]

## 2024-04-22 DIAGNOSIS — G301 Alzheimer's disease with late onset: Secondary | ICD-10-CM | POA: Diagnosis not present

## 2024-04-22 DIAGNOSIS — C50919 Malignant neoplasm of unspecified site of unspecified female breast: Secondary | ICD-10-CM | POA: Diagnosis not present

## 2024-04-22 DIAGNOSIS — Z170 Estrogen receptor positive status [ER+]: Secondary | ICD-10-CM | POA: Diagnosis not present

## 2024-04-22 DIAGNOSIS — Z79811 Long term (current) use of aromatase inhibitors: Secondary | ICD-10-CM | POA: Diagnosis not present

## 2024-04-22 DIAGNOSIS — Z79899 Other long term (current) drug therapy: Secondary | ICD-10-CM | POA: Diagnosis not present

## 2024-05-05 DIAGNOSIS — C50919 Malignant neoplasm of unspecified site of unspecified female breast: Secondary | ICD-10-CM | POA: Diagnosis not present

## 2024-05-05 DIAGNOSIS — I2510 Atherosclerotic heart disease of native coronary artery without angina pectoris: Secondary | ICD-10-CM | POA: Diagnosis not present

## 2024-05-05 DIAGNOSIS — I700 Atherosclerosis of aorta: Secondary | ICD-10-CM | POA: Diagnosis not present

## 2024-05-05 DIAGNOSIS — C7951 Secondary malignant neoplasm of bone: Secondary | ICD-10-CM | POA: Diagnosis not present

## 2024-05-05 DIAGNOSIS — R7309 Other abnormal glucose: Secondary | ICD-10-CM | POA: Diagnosis not present

## 2024-05-05 DIAGNOSIS — Z170 Estrogen receptor positive status [ER+]: Secondary | ICD-10-CM | POA: Diagnosis not present

## 2024-05-09 DIAGNOSIS — G9389 Other specified disorders of brain: Secondary | ICD-10-CM | POA: Diagnosis not present

## 2024-05-09 DIAGNOSIS — G301 Alzheimer's disease with late onset: Secondary | ICD-10-CM | POA: Diagnosis not present

## 2024-05-09 DIAGNOSIS — R413 Other amnesia: Secondary | ICD-10-CM | POA: Diagnosis not present

## 2024-05-17 DIAGNOSIS — C7951 Secondary malignant neoplasm of bone: Secondary | ICD-10-CM | POA: Diagnosis not present

## 2024-05-17 DIAGNOSIS — Z79899 Other long term (current) drug therapy: Secondary | ICD-10-CM | POA: Diagnosis not present

## 2024-05-17 DIAGNOSIS — Z170 Estrogen receptor positive status [ER+]: Secondary | ICD-10-CM | POA: Diagnosis not present

## 2024-05-17 DIAGNOSIS — C50919 Malignant neoplasm of unspecified site of unspecified female breast: Secondary | ICD-10-CM | POA: Diagnosis not present

## 2024-05-17 DIAGNOSIS — Z79811 Long term (current) use of aromatase inhibitors: Secondary | ICD-10-CM | POA: Diagnosis not present

## 2024-06-14 DIAGNOSIS — Z170 Estrogen receptor positive status [ER+]: Secondary | ICD-10-CM | POA: Diagnosis not present

## 2024-06-14 DIAGNOSIS — C189 Malignant neoplasm of colon, unspecified: Secondary | ICD-10-CM | POA: Diagnosis not present

## 2024-06-14 DIAGNOSIS — C50919 Malignant neoplasm of unspecified site of unspecified female breast: Secondary | ICD-10-CM | POA: Diagnosis not present

## 2024-06-14 DIAGNOSIS — C7951 Secondary malignant neoplasm of bone: Secondary | ICD-10-CM | POA: Diagnosis not present

## 2024-07-15 DIAGNOSIS — Z170 Estrogen receptor positive status [ER+]: Secondary | ICD-10-CM | POA: Diagnosis not present

## 2024-07-15 DIAGNOSIS — C50919 Malignant neoplasm of unspecified site of unspecified female breast: Secondary | ICD-10-CM | POA: Diagnosis not present

## 2024-07-15 DIAGNOSIS — Z79811 Long term (current) use of aromatase inhibitors: Secondary | ICD-10-CM | POA: Diagnosis not present

## 2024-07-15 DIAGNOSIS — Z79899 Other long term (current) drug therapy: Secondary | ICD-10-CM | POA: Diagnosis not present

## 2024-07-15 DIAGNOSIS — C7951 Secondary malignant neoplasm of bone: Secondary | ICD-10-CM | POA: Diagnosis not present

## 2024-07-18 DIAGNOSIS — I129 Hypertensive chronic kidney disease with stage 1 through stage 4 chronic kidney disease, or unspecified chronic kidney disease: Secondary | ICD-10-CM | POA: Diagnosis not present

## 2024-07-18 DIAGNOSIS — N183 Chronic kidney disease, stage 3 unspecified: Secondary | ICD-10-CM | POA: Diagnosis not present

## 2024-07-18 DIAGNOSIS — C50919 Malignant neoplasm of unspecified site of unspecified female breast: Secondary | ICD-10-CM | POA: Diagnosis not present

## 2024-07-18 DIAGNOSIS — C7951 Secondary malignant neoplasm of bone: Secondary | ICD-10-CM | POA: Diagnosis not present

## 2024-08-12 ENCOUNTER — Other Ambulatory Visit: Payer: Self-pay | Admitting: Registered Nurse

## 2024-08-12 ENCOUNTER — Ambulatory Visit
Admission: RE | Admit: 2024-08-12 | Discharge: 2024-08-12 | Disposition: A | Discharge status: Home / Self Care | Source: Ambulatory Visit | Attending: Registered Nurse | Admitting: Registered Nurse

## 2024-08-12 DIAGNOSIS — K5900 Constipation, unspecified: Secondary | ICD-10-CM

## 2024-08-12 DIAGNOSIS — Z9181 History of falling: Secondary | ICD-10-CM | POA: Diagnosis not present

## 2024-08-12 DIAGNOSIS — M50322 Other cervical disc degeneration at C5-C6 level: Secondary | ICD-10-CM | POA: Diagnosis not present

## 2024-08-12 DIAGNOSIS — M4184 Other forms of scoliosis, thoracic region: Secondary | ICD-10-CM | POA: Diagnosis not present

## 2024-08-12 DIAGNOSIS — S32030A Wedge compression fracture of third lumbar vertebra, initial encounter for closed fracture: Secondary | ICD-10-CM | POA: Diagnosis not present

## 2024-08-12 DIAGNOSIS — G309 Alzheimer's disease, unspecified: Secondary | ICD-10-CM | POA: Diagnosis not present

## 2024-08-12 DIAGNOSIS — Z7189 Other specified counseling: Secondary | ICD-10-CM | POA: Diagnosis not present

## 2024-08-12 DIAGNOSIS — M4186 Other forms of scoliosis, lumbar region: Secondary | ICD-10-CM | POA: Diagnosis not present

## 2024-08-12 DIAGNOSIS — R636 Underweight: Secondary | ICD-10-CM | POA: Diagnosis not present

## 2024-08-12 DIAGNOSIS — F5101 Primary insomnia: Secondary | ICD-10-CM | POA: Diagnosis not present

## 2024-08-12 DIAGNOSIS — E039 Hypothyroidism, unspecified: Secondary | ICD-10-CM | POA: Diagnosis not present

## 2024-08-12 DIAGNOSIS — N3281 Overactive bladder: Secondary | ICD-10-CM | POA: Diagnosis not present

## 2024-08-12 DIAGNOSIS — Z043 Encounter for examination and observation following other accident: Secondary | ICD-10-CM | POA: Diagnosis not present

## 2024-08-12 DIAGNOSIS — M50323 Other cervical disc degeneration at C6-C7 level: Secondary | ICD-10-CM | POA: Diagnosis not present

## 2024-08-12 DIAGNOSIS — R413 Other amnesia: Secondary | ICD-10-CM | POA: Diagnosis not present

## 2024-08-12 DIAGNOSIS — M47814 Spondylosis without myelopathy or radiculopathy, thoracic region: Secondary | ICD-10-CM | POA: Diagnosis not present

## 2024-08-12 DIAGNOSIS — Z853 Personal history of malignant neoplasm of breast: Secondary | ICD-10-CM | POA: Diagnosis not present

## 2024-08-24 DIAGNOSIS — R636 Underweight: Secondary | ICD-10-CM | POA: Diagnosis not present

## 2024-08-24 DIAGNOSIS — E871 Hypo-osmolality and hyponatremia: Secondary | ICD-10-CM | POA: Diagnosis not present

## 2024-08-24 DIAGNOSIS — N3281 Overactive bladder: Secondary | ICD-10-CM | POA: Diagnosis not present

## 2024-08-24 DIAGNOSIS — S32009A Unspecified fracture of unspecified lumbar vertebra, initial encounter for closed fracture: Secondary | ICD-10-CM | POA: Diagnosis not present

## 2024-08-24 DIAGNOSIS — N390 Urinary tract infection, site not specified: Secondary | ICD-10-CM | POA: Diagnosis not present

## 2024-08-24 DIAGNOSIS — I10 Essential (primary) hypertension: Secondary | ICD-10-CM | POA: Diagnosis not present

## 2024-08-24 DIAGNOSIS — F5101 Primary insomnia: Secondary | ICD-10-CM | POA: Diagnosis not present

## 2024-08-24 DIAGNOSIS — G309 Alzheimer's disease, unspecified: Secondary | ICD-10-CM | POA: Diagnosis not present

## 2024-08-25 DIAGNOSIS — N390 Urinary tract infection, site not specified: Secondary | ICD-10-CM | POA: Diagnosis not present
